# Patient Record
Sex: Female | Born: 1950 | Race: White | Hispanic: No | Marital: Married | Smoking: Former smoker
Health system: Southern US, Community
[De-identification: ages and names within clinical notes are randomized; demographics above are authoritative.]

## PROBLEM LIST (undated history)

## (undated) DIAGNOSIS — I10 Essential (primary) hypertension: Secondary | ICD-10-CM

## (undated) DIAGNOSIS — F419 Anxiety disorder, unspecified: Secondary | ICD-10-CM

## (undated) DIAGNOSIS — N760 Acute vaginitis: Secondary | ICD-10-CM

## (undated) DIAGNOSIS — K589 Irritable bowel syndrome without diarrhea: Secondary | ICD-10-CM

## (undated) DIAGNOSIS — C801 Malignant (primary) neoplasm, unspecified: Secondary | ICD-10-CM

## (undated) DIAGNOSIS — Z5189 Encounter for other specified aftercare: Secondary | ICD-10-CM

## (undated) DIAGNOSIS — E785 Hyperlipidemia, unspecified: Secondary | ICD-10-CM

## (undated) DIAGNOSIS — M1990 Unspecified osteoarthritis, unspecified site: Secondary | ICD-10-CM

## (undated) DIAGNOSIS — I509 Heart failure, unspecified: Secondary | ICD-10-CM

## (undated) DIAGNOSIS — R252 Cramp and spasm: Secondary | ICD-10-CM

## (undated) DIAGNOSIS — D649 Anemia, unspecified: Secondary | ICD-10-CM

## (undated) DIAGNOSIS — E039 Hypothyroidism, unspecified: Secondary | ICD-10-CM

## (undated) DIAGNOSIS — J189 Pneumonia, unspecified organism: Secondary | ICD-10-CM

## (undated) DIAGNOSIS — IMO0002 Reserved for concepts with insufficient information to code with codable children: Secondary | ICD-10-CM

## (undated) DIAGNOSIS — N189 Chronic kidney disease, unspecified: Secondary | ICD-10-CM

## (undated) DIAGNOSIS — K219 Gastro-esophageal reflux disease without esophagitis: Secondary | ICD-10-CM

## (undated) HISTORY — DX: Encounter for other specified aftercare: Z51.89

## (undated) HISTORY — PX: FRACTURE SURGERY: SHX138

## (undated) HISTORY — PX: OTHER SURGICAL HISTORY: SHX169

## (undated) HISTORY — PX: HAMMER TOE SURGERY: SHX385

## (undated) HISTORY — DX: Chronic kidney disease, unspecified: N18.9

## (undated) HISTORY — PX: COSMETIC SURGERY: SHX468

## (undated) HISTORY — PX: ABDOMINAL HYSTERECTOMY: SHX81

## (undated) HISTORY — DX: Hyperlipidemia, unspecified: E78.5

## (undated) HISTORY — PX: RHINOPLASTY: SUR1284

## (undated) HISTORY — PX: ORIF PATELLA: SHX5033

## (undated) HISTORY — DX: Heart failure, unspecified: I50.9

## (undated) HISTORY — DX: Malignant (primary) neoplasm, unspecified: C80.1

## (undated) HISTORY — PX: JOINT REPLACEMENT: SHX530

## (undated) HISTORY — PX: ORIF ULNAR FRACTURE: SHX5417

---

## 2008-02-15 ENCOUNTER — Inpatient Hospital Stay (HOSPITAL_COMMUNITY): Admission: RE | Admit: 2008-02-15 | Discharge: 2008-02-18 | Payer: Self-pay | Admitting: Orthopedic Surgery
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
## 2011-01-16 ENCOUNTER — Emergency Department (HOSPITAL_BASED_OUTPATIENT_CLINIC_OR_DEPARTMENT_OTHER)
Admission: EM | Admit: 2011-01-16 | Discharge: 2011-01-17 | Disposition: A | Discharge status: Home / Self Care | Payer: BC Managed Care – PPO | Attending: Emergency Medicine | Admitting: Emergency Medicine

## 2011-01-16 DIAGNOSIS — Z79899 Other long term (current) drug therapy: Secondary | ICD-10-CM | POA: Insufficient documentation

## 2011-01-16 DIAGNOSIS — R0789 Other chest pain: Secondary | ICD-10-CM | POA: Insufficient documentation

## 2011-01-16 DIAGNOSIS — E785 Hyperlipidemia, unspecified: Secondary | ICD-10-CM | POA: Insufficient documentation

## 2011-01-16 DIAGNOSIS — J189 Pneumonia, unspecified organism: Secondary | ICD-10-CM | POA: Insufficient documentation

## 2011-01-16 DIAGNOSIS — I10 Essential (primary) hypertension: Secondary | ICD-10-CM | POA: Insufficient documentation

## 2011-01-16 DIAGNOSIS — K219 Gastro-esophageal reflux disease without esophagitis: Secondary | ICD-10-CM | POA: Insufficient documentation

## 2011-01-17 ENCOUNTER — Emergency Department (INDEPENDENT_AMBULATORY_CARE_PROVIDER_SITE_OTHER): Payer: BC Managed Care – PPO

## 2011-01-17 DIAGNOSIS — J984 Other disorders of lung: Secondary | ICD-10-CM

## 2011-01-17 DIAGNOSIS — R071 Chest pain on breathing: Secondary | ICD-10-CM

## 2011-01-17 DIAGNOSIS — R509 Fever, unspecified: Secondary | ICD-10-CM

## 2011-01-17 LAB — CBC
HCT: 32.8 % — ABNORMAL LOW (ref 36.0–46.0)
Hemoglobin: 11.3 g/dL — ABNORMAL LOW (ref 12.0–15.0)
MCH: 31.7 pg (ref 26.0–34.0)
MCV: 91.9 fL (ref 78.0–100.0)
Platelets: 195 10*3/uL (ref 150–400)
RBC: 3.57 MIL/uL — ABNORMAL LOW (ref 3.87–5.11)
WBC: 8.4 10*3/uL (ref 4.0–10.5)

## 2011-01-17 LAB — DIFFERENTIAL
Lymphocytes Relative: 5 % — ABNORMAL LOW (ref 12–46)
Lymphs Abs: 0.4 10*3/uL — ABNORMAL LOW (ref 0.7–4.0)
Monocytes Relative: 11 % (ref 3–12)
Neutro Abs: 7 10*3/uL (ref 1.7–7.7)
Neutrophils Relative %: 84 % — ABNORMAL HIGH (ref 43–77)

## 2011-01-17 LAB — BASIC METABOLIC PANEL
BUN: 34 mg/dL — ABNORMAL HIGH (ref 6–23)
CO2: 27 mEq/L (ref 19–32)
Chloride: 96 mEq/L (ref 96–112)
Glucose, Bld: 151 mg/dL — ABNORMAL HIGH (ref 70–99)
Potassium: 3.3 mEq/L — ABNORMAL LOW (ref 3.5–5.1)

## 2011-05-16 LAB — CROSSMATCH
ABO/RH(D): A POS
ABO/RH(D): A POS
Antibody Screen: NEGATIVE
Antibody Screen: NEGATIVE

## 2011-05-16 LAB — CBC
HCT: 27.7 — ABNORMAL LOW
HCT: 39.2
HCT: 24.4 — ABNORMAL LOW
Hemoglobin: 10.9 — ABNORMAL LOW
MCHC: 34.5
MCHC: 34.9
MCV: 95.7
Platelets: 183
Platelets: 245
Platelets: 162
RBC: 3.42 — ABNORMAL LOW
RDW: 13.3
RDW: 14.8
WBC: 4
WBC: 6.2

## 2011-05-16 LAB — BASIC METABOLIC PANEL
BUN: 11
BUN: 5 — ABNORMAL LOW
Calcium: 7.5 — ABNORMAL LOW
Calcium: 7.9 — ABNORMAL LOW
Creatinine, Ser: 0.65
Creatinine, Ser: 0.77
GFR calc Af Amer: >60
GFR calc non Af Amer: >60
GFR calc non Af Amer: >60
GFR calc non Af Amer: >60
Glucose, Bld: 106 — ABNORMAL HIGH
Potassium: 3.6
Potassium: 3.4 — ABNORMAL LOW
Sodium: 139

## 2011-05-16 LAB — COMPREHENSIVE METABOLIC PANEL
Albumin: 4.6
BUN: 14
Calcium: 9.2
Chloride: 104
Creatinine, Ser: 0.69
GFR calc non Af Amer: >60
Total Bilirubin: 0.7

## 2011-05-16 LAB — URINALYSIS, ROUTINE W REFLEX MICROSCOPIC
Ketones, ur: NEGATIVE
Nitrite: NEGATIVE
Protein, ur: NEGATIVE

## 2011-05-16 LAB — DIFFERENTIAL
Basophils Absolute: 0
Lymphocytes Relative: 31
Monocytes Absolute: 0.4
Neutro Abs: 2.2

## 2011-05-16 LAB — PROTIME-INR: Prothrombin Time: 12.2

## 2011-05-16 LAB — URINE CULTURE
Colony Count: NO GROWTH
Culture: NO GROWTH

## 2011-05-16 LAB — APTT: aPTT: 27

## 2012-01-21 ENCOUNTER — Other Ambulatory Visit: Payer: Self-pay | Admitting: Orthopedic Surgery

## 2012-01-24 ENCOUNTER — Encounter (HOSPITAL_COMMUNITY): Payer: Self-pay | Admitting: Respiratory Therapy

## 2012-01-27 ENCOUNTER — Encounter (HOSPITAL_COMMUNITY): Payer: Self-pay

## 2012-01-27 NOTE — Pre-Procedure Instructions (Signed)
20 [name]  01/27/2012   Your procedure is scheduled on:  02/03/2012  Report to [facility] at 5:30 AM.  Call this number if you have problems the morning of surgery: [phone]   Remember:   Do not eat food:After Midnight.SUNDAY  May have clear liquids: up to 4 Hours before arrival.  Nothing after 1:30 a.m.   Clear liquids include soda, tea, black coffee, apple or grape juice, broth.  Take these medicines the morning of surgery with A SIP OF WATER: nexium, atenolol-chlorthalidone, synthroid   Do not wear jewelry, make-up or nail polish.  Do not wear lotions, powders, or perfumes. You may wear deodorant.  Do not shave 48 hours prior to surgery. Men may shave face and neck.  Do not bring valuables to the hospital.  Contacts, dentures or bridgework may not be worn into surgery.  Leave suitcase in the car. After surgery it may be brought to your room.  For patients admitted to the hospital, checkout time is 11:00 AM the day of discharge.   Patients discharged the day of surgery will not be allowed to drive home.  Name and phone number of your driver: /w spouse  Special Instructions: CHG Shower Use Special Wash: 1/2 bottle night before surgery and 1/2 bottle morning of surgery.   Please read over the following fact sheets that you were given: Pain Booklet, Coughing and Deep Breathing, Blood Transfusion Information, MRSA Information and Surgical Site Infection Prevention

## 2012-01-29 ENCOUNTER — Encounter (HOSPITAL_COMMUNITY)
Admission: RE | Admit: 2012-01-29 | Discharge: 2012-01-29 | Disposition: A | Discharge status: Home / Self Care | Payer: BC Managed Care – PPO | Source: Ambulatory Visit | Attending: Orthopedic Surgery | Admitting: Orthopedic Surgery

## 2012-01-29 LAB — PROTIME-INR
INR: 0.96 (ref 0.00–1.49)
Prothrombin Time: 13 seconds (ref 11.6–15.2)

## 2012-01-29 LAB — URINALYSIS, ROUTINE W REFLEX MICROSCOPIC
Glucose, UA: NEGATIVE mg/dL
Nitrite: NEGATIVE
Protein, ur: NEGATIVE mg/dL
Urobilinogen, UA: 0.2 mg/dL (ref 0.0–1.0)

## 2012-01-29 LAB — COMPREHENSIVE METABOLIC PANEL
AST: 30 U/L (ref 0–37)
CO2: 31 mEq/L (ref 19–32)
Calcium: 9.4 mg/dL (ref 8.4–10.5)
Creatinine, Ser: 1.08 mg/dL (ref 0.50–1.10)
GFR calc non Af Amer: 53 mL/min — ABNORMAL LOW (ref >90)

## 2012-01-29 LAB — CBC
MCH: 31.5 pg (ref 26.0–34.0)
MCV: 93 fL (ref 78.0–100.0)
Platelets: 259 10*3/uL (ref 150–400)
RBC: 4 MIL/uL (ref 3.87–5.11)
RDW: 12.8 % (ref 11.5–15.5)

## 2012-01-29 LAB — DIFFERENTIAL
Basophils Absolute: 0 10*3/uL (ref 0.0–0.1)
Lymphocytes Relative: 23 % (ref 12–46)
Monocytes Absolute: 0.5 10*3/uL (ref 0.1–1.0)
Neutro Abs: 4.1 10*3/uL (ref 1.7–7.7)

## 2012-01-29 LAB — URINE MICROSCOPIC-ADD ON

## 2012-01-30 LAB — URINE CULTURE

## 2012-01-30 NOTE — Consult Note (Addendum)
Anesthesia Chart Review: Patient is a 61 year old female scheduled for a right TKA on 02/03/12.  History includes former smoker, HTN, hypothyroidism, PNA, anemia, GERD, anxiety, left TKA '09.  PCP is Dr. Riley Nearing from [place].  Labs noted.  K+ 2.9 (meds include Tenoretic).  Urine culture is still pending.  Potassium results were called to Keri at Dr. Tobin Chad office.  Will order an ISTAT 4 on arrival to re-evaluate her hypokalemia.  She had a negative CXR on 01/29/12.  EKG on 12/30/11 (PCP) showed NSR.  Plan to proceed if repeat K+ is stable.  Shonna Chock, PA-C

## 2012-01-30 NOTE — Progress Notes (Signed)
Requested ekg from Dr.Aguiar's office at [facility] in [place], [place].

## 2012-01-31 ENCOUNTER — Other Ambulatory Visit: Payer: Self-pay | Admitting: Orthopedic Surgery

## 2012-02-02 MED ORDER — CEFAZOLIN SODIUM-DEXTROSE 2-3 GM-% IV SOLR
2.0000 g | INTRAVENOUS | Status: AC
  Administered 2012-02-03: 2 g via INTRAVENOUS
  Filled 2012-02-02: qty 50
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
## 2012-02-03 ENCOUNTER — Encounter (HOSPITAL_COMMUNITY): Payer: Self-pay | Admitting: Vascular Surgery

## 2012-02-03 ENCOUNTER — Ambulatory Visit (HOSPITAL_COMMUNITY): Payer: BC Managed Care – PPO | Admitting: Vascular Surgery

## 2012-02-03 ENCOUNTER — Encounter (HOSPITAL_COMMUNITY)
Admission: RE | Disposition: A | Discharge status: Home / Self Care | Payer: Self-pay | Source: Ambulatory Visit | Attending: Orthopedic Surgery

## 2012-02-03 ENCOUNTER — Encounter (HOSPITAL_COMMUNITY): Payer: Self-pay | Admitting: *Deleted

## 2012-02-03 ENCOUNTER — Inpatient Hospital Stay (HOSPITAL_COMMUNITY)
Admission: RE | Admit: 2012-02-03 | Discharge: 2012-02-06 | DRG: 209 | Disposition: A | Discharge status: Home / Self Care | Payer: BC Managed Care – PPO | Source: Ambulatory Visit | Attending: Orthopedic Surgery | Admitting: Orthopedic Surgery

## 2012-02-03 DIAGNOSIS — M19079 Primary osteoarthritis, unspecified ankle and foot: Secondary | ICD-10-CM | POA: Diagnosis present

## 2012-02-03 DIAGNOSIS — F410 Panic disorder [episodic paroxysmal anxiety] without agoraphobia: Secondary | ICD-10-CM | POA: Diagnosis present

## 2012-02-03 DIAGNOSIS — K219 Gastro-esophageal reflux disease without esophagitis: Secondary | ICD-10-CM | POA: Diagnosis present

## 2012-02-03 DIAGNOSIS — I10 Essential (primary) hypertension: Secondary | ICD-10-CM | POA: Diagnosis present

## 2012-02-03 DIAGNOSIS — Z01812 Encounter for preprocedural laboratory examination: Secondary | ICD-10-CM

## 2012-02-03 DIAGNOSIS — Z96659 Presence of unspecified artificial knee joint: Secondary | ICD-10-CM

## 2012-02-03 DIAGNOSIS — E890 Postprocedural hypothyroidism: Secondary | ICD-10-CM | POA: Diagnosis present

## 2012-02-03 DIAGNOSIS — M1712 Unilateral primary osteoarthritis, left knee: Secondary | ICD-10-CM

## 2012-02-03 DIAGNOSIS — Z8701 Personal history of pneumonia (recurrent): Secondary | ICD-10-CM

## 2012-02-03 DIAGNOSIS — Z9089 Acquired absence of other organs: Secondary | ICD-10-CM

## 2012-02-03 DIAGNOSIS — Z87891 Personal history of nicotine dependence: Secondary | ICD-10-CM

## 2012-02-03 DIAGNOSIS — M1710 Unilateral primary osteoarthritis, unspecified knee: Principal | ICD-10-CM | POA: Diagnosis present

## 2012-02-03 DIAGNOSIS — D62 Acute posthemorrhagic anemia: Secondary | ICD-10-CM | POA: Diagnosis not present

## 2012-02-03 HISTORY — DX: Pneumonia, unspecified organism: J18.9

## 2012-02-03 HISTORY — DX: Acute vaginitis: N76.0

## 2012-02-03 HISTORY — PX: TOTAL KNEE ARTHROPLASTY: SHX125

## 2012-02-03 HISTORY — DX: Hypothyroidism, unspecified: E03.9

## 2012-02-03 HISTORY — DX: Essential (primary) hypertension: I10

## 2012-02-03 HISTORY — DX: Anxiety disorder, unspecified: F41.9

## 2012-02-03 HISTORY — DX: Cramp and spasm: R25.2

## 2012-02-03 HISTORY — PX: KNEE SURGERY: SHX244

## 2012-02-03 HISTORY — DX: Anemia, unspecified: D64.9

## 2012-02-03 HISTORY — DX: Unspecified osteoarthritis, unspecified site: M19.90

## 2012-02-03 HISTORY — DX: Gastro-esophageal reflux disease without esophagitis: K21.9

## 2012-02-03 HISTORY — DX: Reserved for concepts with insufficient information to code with codable children: IMO0002

## 2012-02-03 LAB — POCT I-STAT 4, (NA,K, GLUC, HGB,HCT)
HCT: 35 % — ABNORMAL LOW (ref 36.0–46.0)
Hemoglobin: 11.9 g/dL — ABNORMAL LOW (ref 12.0–15.0)
Potassium: 2.9 mEq/L — ABNORMAL LOW (ref 3.5–5.1)
Sodium: 139 mEq/L (ref 135–145)

## 2012-02-03 SURGERY — ARTHROPLASTY, KNEE, TOTAL
Anesthesia: General | Site: Knee | Laterality: Right | Wound class: Clean

## 2012-02-03 MED ORDER — SODIUM CHLORIDE 0.9 % IR SOLN
Status: DC | PRN
  Administered 2012-02-03: 3000 mL

## 2012-02-03 MED ORDER — BUPIVACAINE 0.25 % ON-Q PUMP SINGLE CATH 300ML
300.0000 mL | INJECTION | Status: DC
  Filled 2012-02-03: qty 300

## 2012-02-03 MED ORDER — ATENOLOL 50 MG PO TABS
50.0000 mg | ORAL_TABLET | Freq: Every day | ORAL | Status: DC
Start: 2012-02-04 — End: 2012-02-06
  Administered 2012-02-04 – 2012-02-06 (×2): 50 mg via ORAL
  Filled 2012-02-03 (×4): qty 1

## 2012-02-03 MED ORDER — NEOSTIGMINE METHYLSULFATE 1 MG/ML IJ SOLN
INTRAMUSCULAR | Status: DC | PRN
  Administered 2012-02-03: 3 mg via INTRAVENOUS

## 2012-02-03 MED ORDER — METOCLOPRAMIDE HCL 5 MG/ML IJ SOLN
5.0000 mg | Freq: Three times a day (TID) | INTRAMUSCULAR | Status: DC | PRN
  Administered 2012-02-04: 10 mg via INTRAVENOUS
  Filled 2012-02-03: qty 2

## 2012-02-03 MED ORDER — OXYCODONE HCL 5 MG PO TABS
5.0000 mg | ORAL_TABLET | ORAL | Status: DC | PRN
  Administered 2012-02-03: 10 mg via ORAL
  Administered 2012-02-03 – 2012-02-04 (×2): 5 mg via ORAL
  Administered 2012-02-04 – 2012-02-06 (×6): 10 mg via ORAL
  Filled 2012-02-03 (×3): qty 2
  Filled 2012-02-03: qty 1
  Filled 2012-02-03 (×2): qty 2
  Filled 2012-02-03: qty 1
  Filled 2012-02-03: qty 2

## 2012-02-03 MED ORDER — OXYCODONE HCL 10 MG PO TB12
10.0000 mg | ORAL_TABLET | Freq: Two times a day (BID) | ORAL | Status: DC
  Administered 2012-02-03 – 2012-02-06 (×6): 10 mg via ORAL
  Filled 2012-02-03 (×6): qty 1

## 2012-02-03 MED ORDER — LEVOTHYROXINE SODIUM 125 MCG PO TABS
125.0000 ug | ORAL_TABLET | Freq: Every day | ORAL | Status: DC
  Administered 2012-02-04 – 2012-02-06 (×3): 125 ug via ORAL
  Filled 2012-02-03 (×4): qty 1

## 2012-02-03 MED ORDER — GLYCOPYRROLATE 0.2 MG/ML IJ SOLN
INTRAMUSCULAR | Status: DC | PRN
  Administered 2012-02-03: 0.4 mg via INTRAVENOUS

## 2012-02-03 MED ORDER — ACETAMINOPHEN 10 MG/ML IV SOLN
INTRAVENOUS | Status: AC
  Filled 2012-02-03: qty 100

## 2012-02-03 MED ORDER — ROCURONIUM BROMIDE 100 MG/10ML IV SOLN
INTRAVENOUS | Status: DC | PRN
  Administered 2012-02-03: 50 mg via INTRAVENOUS

## 2012-02-03 MED ORDER — CHLORHEXIDINE GLUCONATE 4 % EX LIQD: 60.0000 mL | Freq: Once | CUTANEOUS | Status: DC

## 2012-02-03 MED ORDER — ONDANSETRON HCL 4 MG/2ML IJ SOLN
4.0000 mg | Freq: Four times a day (QID) | INTRAMUSCULAR | Status: DC | PRN
  Administered 2012-02-03 – 2012-02-04 (×2): 4 mg via INTRAVENOUS
  Filled 2012-02-03 (×2): qty 2

## 2012-02-03 MED ORDER — FLEET ENEMA 7-19 GM/118ML RE ENEM: 1.0000 | ENEMA | Freq: Once | RECTAL | Status: AC | PRN

## 2012-02-03 MED ORDER — MIDAZOLAM HCL 2 MG/2ML IJ SOLN
1.0000 mg | Freq: Once | INTRAMUSCULAR | Status: AC
  Administered 2012-02-03: 0.5 mg via INTRAVENOUS

## 2012-02-03 MED ORDER — METRONIDAZOLE 500 MG PO TABS
500.0000 mg | ORAL_TABLET | Freq: Two times a day (BID) | ORAL | Status: DC
  Administered 2012-02-03 – 2012-02-05 (×6): 500 mg via ORAL
  Filled 2012-02-03 (×8): qty 1

## 2012-02-03 MED ORDER — MENTHOL 3 MG MT LOZG: 1.0000 | LOZENGE | OROMUCOSAL | Status: DC | PRN

## 2012-02-03 MED ORDER — BUPIVACAINE-EPINEPHRINE 0.25% -1:200000 IJ SOLN
INTRAMUSCULAR | Status: DC | PRN
  Administered 2012-02-03: 20 mL

## 2012-02-03 MED ORDER — IBUPROFEN 800 MG PO TABS
800.0000 mg | ORAL_TABLET | Freq: Three times a day (TID) | ORAL | Status: DC | PRN
  Filled 2012-02-03: qty 1

## 2012-02-03 MED ORDER — METHOCARBAMOL 100 MG/ML IJ SOLN: 500.0000 mg | Freq: Once | INTRAVENOUS | Status: DC

## 2012-02-03 MED ORDER — CHLORTHALIDONE 25 MG PO TABS
25.0000 mg | ORAL_TABLET | Freq: Every day | ORAL | Status: DC
  Administered 2012-02-04 – 2012-02-06 (×2): 25 mg via ORAL
  Filled 2012-02-03 (×4): qty 1

## 2012-02-03 MED ORDER — PHENOL 1.4 % MT LIQD: 1.0000 | OROMUCOSAL | Status: DC | PRN

## 2012-02-03 MED ORDER — ONDANSETRON HCL 4 MG PO TABS: 4.0000 mg | ORAL_TABLET | Freq: Four times a day (QID) | ORAL | Status: DC | PRN

## 2012-02-03 MED ORDER — SIMVASTATIN 40 MG PO TABS
40.0000 mg | ORAL_TABLET | Freq: Every evening | ORAL | Status: DC
  Administered 2012-02-03 – 2012-02-05 (×3): 40 mg via ORAL
  Filled 2012-02-03 (×4): qty 1

## 2012-02-03 MED ORDER — DIPHENHYDRAMINE HCL 12.5 MG/5ML PO ELIX: 12.5000 mg | ORAL_SOLUTION | ORAL | Status: DC | PRN

## 2012-02-03 MED ORDER — FENTANYL CITRATE 0.05 MG/ML IJ SOLN
INTRAMUSCULAR | Status: DC | PRN
  Administered 2012-02-03: 150 ug via INTRAVENOUS
  Administered 2012-02-03 (×4): 50 ug via INTRAVENOUS

## 2012-02-03 MED ORDER — HYDROMORPHONE HCL PF 1 MG/ML IJ SOLN
1.0000 mg | INTRAMUSCULAR | Status: DC | PRN
  Administered 2012-02-03 – 2012-02-05 (×5): 1 mg via INTRAVENOUS
  Filled 2012-02-03 (×5): qty 1

## 2012-02-03 MED ORDER — SENNOSIDES-DOCUSATE SODIUM 8.6-50 MG PO TABS: 1.0000 | ORAL_TABLET | Freq: Every evening | ORAL | Status: DC | PRN

## 2012-02-03 MED ORDER — METHOCARBAMOL 100 MG/ML IJ SOLN
500.0000 mg | Freq: Four times a day (QID) | INTRAVENOUS | Status: DC | PRN
  Administered 2012-02-03: 500 mg via INTRAVENOUS
  Filled 2012-02-03: qty 5

## 2012-02-03 MED ORDER — LACTATED RINGERS IV SOLN
INTRAVENOUS | Status: DC | PRN
  Administered 2012-02-03 (×3): via INTRAVENOUS

## 2012-02-03 MED ORDER — HYDROMORPHONE HCL PF 1 MG/ML IJ SOLN
INTRAMUSCULAR | Status: AC
  Administered 2012-02-04: 1 mg via INTRAVENOUS
  Filled 2012-02-03: qty 1

## 2012-02-03 MED ORDER — DOCUSATE SODIUM 100 MG PO CAPS
100.0000 mg | ORAL_CAPSULE | Freq: Two times a day (BID) | ORAL | Status: DC
  Administered 2012-02-03 – 2012-02-06 (×6): 100 mg via ORAL
  Filled 2012-02-03 (×8): qty 1

## 2012-02-03 MED ORDER — METHOCARBAMOL 100 MG/ML IJ SOLN
500.0000 mg | Freq: Once | INTRAVENOUS | Status: AC
  Administered 2012-02-03: 500 mg via INTRAVENOUS
  Filled 2012-02-03: qty 5

## 2012-02-03 MED ORDER — SODIUM CHLORIDE 0.9 % IV SOLN
INTRAVENOUS | Status: DC
  Administered 2012-02-03 – 2012-02-04 (×3): via INTRAVENOUS

## 2012-02-03 MED ORDER — MIDAZOLAM HCL 5 MG/5ML IJ SOLN
INTRAMUSCULAR | Status: DC | PRN
  Administered 2012-02-03 (×2): 1 mg via INTRAVENOUS

## 2012-02-03 MED ORDER — OXYCODONE HCL 5 MG PO TABS
ORAL_TABLET | ORAL | Status: AC
  Filled 2012-02-03: qty 2

## 2012-02-03 MED ORDER — LIDOCAINE HCL (CARDIAC) 20 MG/ML IV SOLN
INTRAVENOUS | Status: DC | PRN
  Administered 2012-02-03: 80 mg via INTRAVENOUS

## 2012-02-03 MED ORDER — CEPHALEXIN 500 MG PO CAPS
500.0000 mg | ORAL_CAPSULE | Freq: Three times a day (TID) | ORAL | Status: DC
  Administered 2012-02-05 (×3): 500 mg via ORAL
  Filled 2012-02-03 (×6): qty 1

## 2012-02-03 MED ORDER — METHOCARBAMOL 500 MG PO TABS
500.0000 mg | ORAL_TABLET | Freq: Four times a day (QID) | ORAL | Status: DC | PRN
  Administered 2012-02-03 – 2012-02-06 (×4): 500 mg via ORAL
  Filled 2012-02-03 (×5): qty 1

## 2012-02-03 MED ORDER — ALUM & MAG HYDROXIDE-SIMETH 200-200-20 MG/5ML PO SUSP: 30.0000 mL | ORAL | Status: DC | PRN

## 2012-02-03 MED ORDER — ONDANSETRON HCL 4 MG/2ML IJ SOLN
INTRAMUSCULAR | Status: DC | PRN
  Administered 2012-02-03: 4 mg via INTRAVENOUS

## 2012-02-03 MED ORDER — BUPIVACAINE ON-Q PAIN PUMP (FOR ORDER SET NO CHG)
INJECTION | Status: AC
  Filled 2012-02-03: qty 1

## 2012-02-03 MED ORDER — ZOLPIDEM TARTRATE 5 MG PO TABS: 5.0000 mg | ORAL_TABLET | Freq: Every evening | ORAL | Status: DC | PRN

## 2012-02-03 MED ORDER — BUPIVACAINE 0.25 % ON-Q PUMP SINGLE CATH 300ML
INJECTION | Status: DC | PRN
  Administered 2012-02-03: 300 mL

## 2012-02-03 MED ORDER — ACETAMINOPHEN 650 MG RE SUPP: 650.0000 mg | Freq: Four times a day (QID) | RECTAL | Status: DC | PRN

## 2012-02-03 MED ORDER — SENNA 8.6 MG PO TABS
1.0000 | ORAL_TABLET | Freq: Two times a day (BID) | ORAL | Status: DC
  Administered 2012-02-03 – 2012-02-06 (×6): 8.6 mg via ORAL
  Filled 2012-02-03 (×8): qty 1

## 2012-02-03 MED ORDER — ACETAMINOPHEN 10 MG/ML IV SOLN
1000.0000 mg | Freq: Four times a day (QID) | INTRAVENOUS | Status: DC
  Administered 2012-02-03: 1000 mg via INTRAVENOUS

## 2012-02-03 MED ORDER — HYDROMORPHONE HCL PF 1 MG/ML IJ SOLN
INTRAMUSCULAR | Status: AC
  Filled 2012-02-03: qty 1

## 2012-02-03 MED ORDER — PROPOFOL 10 MG/ML IV EMUL
INTRAVENOUS | Status: DC | PRN
  Administered 2012-02-03: 150 mg via INTRAVENOUS

## 2012-02-03 MED ORDER — PANTOPRAZOLE SODIUM 40 MG PO TBEC
80.0000 mg | DELAYED_RELEASE_TABLET | Freq: Every day | ORAL | Status: DC
  Administered 2012-02-04 – 2012-02-06 (×3): 80 mg via ORAL
  Filled 2012-02-03 (×3): qty 2

## 2012-02-03 MED ORDER — SODIUM CHLORIDE 0.9 % IV SOLN: INTRAVENOUS | Status: DC

## 2012-02-03 MED ORDER — HYDROMORPHONE HCL PF 1 MG/ML IJ SOLN
0.2500 mg | INTRAMUSCULAR | Status: DC | PRN
  Administered 2012-02-03 (×4): 0.5 mg via INTRAVENOUS

## 2012-02-03 MED ORDER — ACETAMINOPHEN 10 MG/ML IV SOLN
1000.0000 mg | Freq: Four times a day (QID) | INTRAVENOUS | Status: AC
  Administered 2012-02-03 – 2012-02-04 (×3): 1000 mg via INTRAVENOUS
  Filled 2012-02-03 (×4): qty 100

## 2012-02-03 MED ORDER — ENOXAPARIN SODIUM 30 MG/0.3ML ~~LOC~~ SOLN
30.0000 mg | Freq: Two times a day (BID) | SUBCUTANEOUS | Status: DC
  Administered 2012-02-04 – 2012-02-06 (×5): 30 mg via SUBCUTANEOUS
  Filled 2012-02-03 (×7): qty 0.3

## 2012-02-03 MED ORDER — EPHEDRINE SULFATE 50 MG/ML IJ SOLN
INTRAMUSCULAR | Status: DC | PRN
  Administered 2012-02-03 (×2): 5 mg via INTRAVENOUS

## 2012-02-03 MED ORDER — METOCLOPRAMIDE HCL 10 MG PO TABS: 5.0000 mg | ORAL_TABLET | Freq: Three times a day (TID) | ORAL | Status: DC | PRN

## 2012-02-03 MED ORDER — MIDAZOLAM HCL 2 MG/2ML IJ SOLN
INTRAMUSCULAR | Status: AC
  Filled 2012-02-03: qty 2

## 2012-02-03 MED ORDER — CEFAZOLIN SODIUM 1-5 GM-% IV SOLN
1.0000 g | Freq: Four times a day (QID) | INTRAVENOUS | Status: AC
  Administered 2012-02-03 (×2): 1 g via INTRAVENOUS
  Filled 2012-02-03 (×2): qty 50

## 2012-02-03 MED ORDER — BISACODYL 10 MG RE SUPP: 10.0000 mg | Freq: Every day | RECTAL | Status: DC | PRN

## 2012-02-03 MED ORDER — POTASSIUM CHLORIDE CRYS ER 10 MEQ PO TBCR
10.0000 meq | EXTENDED_RELEASE_TABLET | Freq: Two times a day (BID) | ORAL | Status: DC
  Administered 2012-02-03 – 2012-02-05 (×6): 10 meq via ORAL
  Filled 2012-02-03 (×8): qty 1

## 2012-02-03 MED ORDER — ACETAMINOPHEN 325 MG PO TABS
650.0000 mg | ORAL_TABLET | Freq: Four times a day (QID) | ORAL | Status: DC | PRN
  Administered 2012-02-04 – 2012-02-05 (×2): 650 mg via ORAL
  Filled 2012-02-03 (×2): qty 2

## 2012-02-03 MED ORDER — ATENOLOL-CHLORTHALIDONE 50-25 MG PO TABS: 1.0000 | ORAL_TABLET | Freq: Every day | ORAL | Status: DC

## 2012-02-03 MED ORDER — ALENDRONATE SODIUM 35 MG PO TABS: 35.0000 mg | ORAL_TABLET | ORAL | Status: DC

## 2012-02-03 SURGICAL SUPPLY — 58 items
BANDAGE ELASTIC 6 VELCRO ST LF (GAUZE/BANDAGES/DRESSINGS) ×1 IMPLANT
BANDAGE ESMARK 6X9 LF (GAUZE/BANDAGES/DRESSINGS) ×1 IMPLANT
BLADE SAGITTAL 13X1.27X60 (BLADE) ×2 IMPLANT
BLADE SAW SGTL 83.5X18.5 (BLADE) ×2 IMPLANT
BNDG CMPR 9X6 STRL LF SNTH (GAUZE/BANDAGES/DRESSINGS) ×1
BNDG ESMARK 6X9 LF (GAUZE/BANDAGES/DRESSINGS) ×2
BOWL SMART MIX CTS (DISPOSABLE) ×2 IMPLANT
CATH KIT ON Q 5IN SLV (PAIN MANAGEMENT) ×2 IMPLANT
CEMENT BONE SIMPLEX SPEEDSET (Cement) ×4 IMPLANT
CLOTH BEACON ORANGE TIMEOUT ST (SAFETY) ×2 IMPLANT
COVER BACK TABLE 24X17X13 BIG (DRAPES) IMPLANT
COVER SURGICAL LIGHT HANDLE (MISCELLANEOUS) ×2 IMPLANT
CUFF TOURNIQUET SINGLE 34IN LL (TOURNIQUET CUFF) ×2 IMPLANT
DRAPE EXTREMITY T 121X128X90 (DRAPE) ×2 IMPLANT
DRAPE INCISE IOBAN 66X45 STRL (DRAPES) ×4 IMPLANT
DRAPE PROXIMA HALF (DRAPES) ×2 IMPLANT
DRAPE U-SHAPE 47X51 STRL (DRAPES) ×2 IMPLANT
DRSG ADAPTIC 3X8 NADH LF (GAUZE/BANDAGES/DRESSINGS) ×2 IMPLANT
DRSG PAD ABDOMINAL 8X10 ST (GAUZE/BANDAGES/DRESSINGS) ×2 IMPLANT
DURAPREP 26ML APPLICATOR (WOUND CARE) ×4 IMPLANT
ELECT REM PT RETURN 9FT ADLT (ELECTROSURGICAL) ×2
ELECTRODE REM PT RTRN 9FT ADLT (ELECTROSURGICAL) ×1 IMPLANT
EVACUATOR 1/8 PVC DRAIN (DRAIN) ×2 IMPLANT
GLOVE BIOGEL M 7.0 STRL (GLOVE) IMPLANT
GLOVE BIOGEL PI IND STRL 7.5 (GLOVE) IMPLANT
GLOVE BIOGEL PI IND STRL 8.5 (GLOVE) ×2 IMPLANT
GLOVE BIOGEL PI INDICATOR 7.5 (GLOVE)
GLOVE BIOGEL PI INDICATOR 8.5 (GLOVE) ×2
GLOVE SURG ORTHO 8.0 STRL STRW (GLOVE) ×4 IMPLANT
GOWN PREVENTION PLUS XLARGE (GOWN DISPOSABLE) ×4 IMPLANT
GOWN STRL NON-REIN LRG LVL3 (GOWN DISPOSABLE) ×4 IMPLANT
HANDPIECE INTERPULSE COAX TIP (DISPOSABLE) ×2
HOOD PEEL AWAY FACE SHEILD DIS (HOOD) ×8 IMPLANT
KIT BASIN OR (CUSTOM PROCEDURE TRAY) ×2 IMPLANT
KIT ROOM TURNOVER OR (KITS) ×2 IMPLANT
MANIFOLD NEPTUNE II (INSTRUMENTS) ×2 IMPLANT
NEEDLE 22X1 1/2 (OR ONLY) (NEEDLE) IMPLANT
NS IRRIG 1000ML POUR BTL (IV SOLUTION) ×2 IMPLANT
PACK TOTAL JOINT (CUSTOM PROCEDURE TRAY) ×2 IMPLANT
PAD ARMBOARD 7.5X6 YLW CONV (MISCELLANEOUS) ×4 IMPLANT
PADDING CAST COTTON 6X4 STRL (CAST SUPPLIES) ×2 IMPLANT
POSITIONER HEAD PRONE TRACH (MISCELLANEOUS) ×2 IMPLANT
SET HNDPC FAN SPRY TIP SCT (DISPOSABLE) ×1 IMPLANT
SPONGE GAUZE 4X4 12PLY (GAUZE/BANDAGES/DRESSINGS) ×2 IMPLANT
STAPLER VISISTAT 35W (STAPLE) ×2 IMPLANT
SUCTION FRAZIER TIP 10 FR DISP (SUCTIONS) ×2 IMPLANT
SUT BONE WAX W31G (SUTURE) ×2 IMPLANT
SUT VIC AB 0 CTB1 27 (SUTURE) ×4 IMPLANT
SUT VIC AB 1 CT1 27 (SUTURE) ×2
SUT VIC AB 1 CT1 27XBRD ANBCTR (SUTURE) ×1 IMPLANT
SUT VIC AB 2-0 CT1 27 (SUTURE) ×4
SUT VIC AB 2-0 CT1 TAPERPNT 27 (SUTURE) ×2 IMPLANT
SUT VLOC 180 0 24IN GS25 (SUTURE) ×2 IMPLANT
SYR CONTROL 10ML LL (SYRINGE) IMPLANT
TOWEL OR 17X24 6PK STRL BLUE (TOWEL DISPOSABLE) ×2 IMPLANT
TOWEL OR 17X26 10 PK STRL BLUE (TOWEL DISPOSABLE) ×2 IMPLANT
TRAY FOLEY CATH 14FR (SET/KITS/TRAYS/PACK) ×2 IMPLANT
WATER STERILE IRR 1000ML POUR (IV SOLUTION) ×6 IMPLANT

## 2012-02-03 NOTE — OR Nursing (Signed)
Spoke with maurice pa re ongoing muscle spasms / ordered add'l dose ive robaxin now

## 2012-02-03 NOTE — Anesthesia Preprocedure Evaluation (Addendum)
Anesthesia Evaluation  Patient identified by MRN, date of birth, ID band Patient awake    Reviewed: Allergy & Precautions, H&P , NPO status , Patient's Chart, lab work & pertinent test results, reviewed documented beta blocker date and time   History of Anesthesia Complications Negative for: history of anesthetic complications  Airway Mallampati: II      Dental  (+) Teeth Intact and Dental Advisory Given   Pulmonary pneumonia ,          Cardiovascular hypertension, Pt. on medications and Pt. on home beta blockers Rhythm:Regular Rate:Normal     Neuro/Psych    GI/Hepatic GERD-  Medicated and Controlled,  Endo/Other  Hypothyroidism   Renal/GU negative Renal ROS     Musculoskeletal   Abdominal   Peds  Hematology negative hematology ROS (+)   Anesthesia Other Findings   Reproductive/Obstetrics                          Anesthesia Physical Anesthesia Plan  ASA: III  Anesthesia Plan: General   Post-op Pain Management:    Induction: Intravenous  Airway Management Planned: Oral ETT  Additional Equipment:   Intra-op Plan:   Post-operative Plan:   Informed Consent:   Plan Discussed with: CRNA  Anesthesia Plan Comments:         Anesthesia Quick Evaluation

## 2012-02-03 NOTE — Op Note (Signed)
TOTAL KNEE REPLACEMENT OPERATIVE NOTE:  02/03/2012  2:15 PM  PATIENT:  [name]  61 y.o. female  PRE-OPERATIVE DIAGNOSIS:  osteoarthritis right knee  POST-OPERATIVE DIAGNOSIS:  osteoarthritis right knee  PROCEDURE:  Procedure(s): TOTAL KNEE ARTHROPLASTY  SURGEON:  Surgeon(s): Raymon Mutton, MD  PHYSICIAN ASSISTANT: Altamese Cabal, [facility]  ANESTHESIA:   general  DRAINS: Hemovac and On-Q Marcaine Pain Pump  SPECIMEN: None  COUNTS:  Correct  TOURNIQUET:   Total Tourniquet Time Documented: Thigh (Right) - 52 minutes  DICTATION:  Indication for procedure:    The patient is a 61 y.o. female who has failed conservative treatment for osteoarthritis right knee.  Informed consent was obtained prior to anesthesia. The risks versus benefits of the operation were explain and in a way the patient can, and did, understand.   Description of procedure:     The patient was taken to the operating room and placed under anesthesia.  The patient was positioned in the usual fashion taking care that all body parts were adequately padded and/or protected.  I foley catheter was placed.  A tourniquet was applied and the leg prepped and draped in the usual sterile fashion.  The extremity was exsanguinated with the esmarch and tourniquet inflated to 350 mmHg.  Pre-operative range of motion was normal.  The knee was in 9 degree of significant valgus.  A midline incision approximately 6-7 inches long was made with a #10 blade.  A new blade was used to make a parapatellar arthrotomy going 2-3 cm into the quadriceps tendon, over the patella, and alongside the medial aspect of the patellar tendon.  A synovectomy was then performed with the #10 blade and forceps. I then elevated the deep MCL off the medial tibial metaphysis subperiosteally around to the semimembranosus attachment.    I everted the patella and used calipers to measure patellar thickness.  I used the reamer to ream down to appropriate  thickness to recreate the native thickness.  I then removed excess bone with the rongeur and sagittal saw.  I used the appropriately sized template and drilled the three lug holes.  I then put the trial in place and measured the thickness with the calipers to ensure recreation of the native thickness.  The trial was then removed and the patella subluxed and the knee brought into flexion.  A homan retractor was place to retract and protect the patella and lateral structures.  A Z-retractor was place medially to protect the medial structures.  The extra-medullary alignment system was used to make cut the tibial articular surface perpendicular to the anamotic axis of the tibia and in 3 degrees of posterior slope.  The cut surface and alignment jig was removed.  I then used the intramedullary alignment guide to make a 4 valgus cut on the distal femur.  I then marked out the epicondylar axis on the distal femur.  The posterior condylar axis measured 5 degrees.  I then used the anterior referencing sizer and measured the femur to be a size F.  The 4-In-1 cutting block was screwed into place in external rotation matching the posterior condylar angle, making our cuts perpendicular to the epicondylar axis.  Anterior, posterior and chamfer cuts were made with the sagittal saw.  The cutting block and cut pieces were removed.  A lamina spreader was placed in 90 degrees of flexion.  The ACL, PCL, menisci, and posterior condylar osteophytes were removed.  A 10 mm spacer blocked was found to offer good flexion  and extension gap balance after moderate in degree releasing.   The scoop retractor was then placed and the femoral finishing block was pinned in place.  The small sagittal saw was used as well as the lug drill to finish the femur.  The block and cut surfaces were removed and the medullary canal hole filled with autograft bone from the cut pieces.  The tibia was delivered forward in deep flexion and external  rotation.  A size 5 tray was selected and pinned into place centered on the medial 1/3 of the tibial tubercle.  The reamer and keel was used to prepare the tibia through the tray.    I then trialed with the size F femur, size 5 tibia, a 10 mm insert and the 32 patella.  I had excellent flexion/extension gap balance, excellent patella tracking.  Flexion was full and beyond 120 degrees; extension was zero.  These components were chosen and the staff opened them to me on the back table while the knee was lavaged copiously and the cement mixed.  I cemented in the components and removed all excess cement.  The polyethylene tibial component was snapped into place and the knee placed in extension while cement was hardening.  The capsule was infilltrated with 20cc of .25% Marcaine with epinephrine.  A hemovac was place in the joint exiting superolaterally.  A pain pump was place superomedially superficial to the arthrotomy.  Once the cement was hard, the tourniquet was let down.  Hemostasis was obtained.  The arthrotomy was closed with figure-8 #1 vicryl sutures.  The deep soft tissues were closed with #0 vicryls and the subcuticular layer closed with a running #2-0 vicryl.  The skin was reapproximated and closed with skin staples.  The wound was dressed with xeroform, 4 x4's, 2 ABD sponges, a single layer of webril and a TED stocking.   The patient was then awakened, extubated, and taken to the recovery room in stable condition.  BLOOD LOSS:  300cc DRAINS: 1 hemovac, 1 pain catheter COMPLICATIONS:  None.  PLAN OF CARE: Admit to inpatient   PATIENT DISPOSITION:  PACU - hemodynamically stable.   Delay start of Pharmacological VTE agent (>24hrs) due to surgical blood loss or risk of bleeding:  not applicable  Please fax a copy of this op note to my office at [phone] (please only include page 1 and 2 of the Case Information op note)

## 2012-02-03 NOTE — Anesthesia Procedure Notes (Addendum)
Procedure Name: Intubation Date/Time: 02/03/2012 7:44 AM Performed by: Lovie Chol Pre-anesthesia Checklist: Patient identified, Emergency Drugs available, Suction available, Patient being monitored and Timeout performed Patient Re-evaluated:Patient Re-evaluated prior to inductionOxygen Delivery Method: Circle system utilized Preoxygenation: Pre-oxygenation with 100% oxygen Intubation Type: IV induction Ventilation: Mask ventilation without difficulty Laryngoscope Size: Miller and 2 Grade View: Grade I Tube type: Oral Tube size: 7.5 mm Number of attempts: 1 Airway Equipment and Method: Stylet Placement Confirmation: ETT inserted through vocal cords under direct vision,  positive ETCO2 and breath sounds checked- equal and bilateral Secured at: 21 cm Tube secured with: Tape Dental Injury: Teeth and Oropharynx as per pre-operative assessment    Anesthesia Regional Block:  Femoral nerve block  Pre-Anesthetic Checklist: ,, timeout performed, Correct Patient, Correct Site, Correct Laterality, Correct Procedure, Correct Position, site marked, Risks and benefits discussed,  Surgical consent,  Pre-op evaluation,  At surgeon's request and post-op pain management  Laterality: Right  Prep: chloraprep       Needles:   Needle Type: Echogenic Needle          Additional Needles:  Procedures: Doppler guided Femoral nerve block  Nerve Stimulator or Paresthesia:  Response: 0.5 mA,   Additional Responses:   Narrative:  Start time: 02/03/2012 7:10 AM End time: 02/03/2012 7:25 AM  Performed by: Personally  Anesthesiologist: Edwards  Femoral nerve block

## 2012-02-03 NOTE — Preoperative (Signed)
Beta Blockers   Reason not to administer Beta Blockers:Not Applicable 

## 2012-02-03 NOTE — Anesthesia Postprocedure Evaluation (Signed)
  Anesthesia Post-op Note  Patient: [name]  Procedure(s) Performed: Procedure(s) (LRB): TOTAL KNEE ARTHROPLASTY (Right)  Patient Location: PACU  Anesthesia Type: General  Level of Consciousness: awake  Airway and Oxygen Therapy: Patient Spontanous Breathing  Post-op Pain: mild  Post-op Assessment: Post-op Vital signs reviewed  Post-op Vital Signs: Reviewed  Complications: No apparent anesthesia complications

## 2012-02-03 NOTE — Transfer of Care (Signed)
Immediate Anesthesia Transfer of Care Note  Patient: [name]  Procedure(s) Performed: Procedure(s) (LRB): TOTAL KNEE ARTHROPLASTY (Right)  Patient Location: PACU  Anesthesia Type: General  Level of Consciousness: awake, oriented and patient cooperative  Airway & Oxygen Therapy: Patient Spontanous Breathing and Patient connected to nasal cannula oxygen  Post-op Assessment: Report given to PACU RN and Post -op Vital signs reviewed and stable  Post vital signs: Reviewed  Complications: No apparent anesthesia complications

## 2012-02-03 NOTE — Progress Notes (Signed)
Orthopedic Tech Progress Note Patient Details:  [name] [birth year] [id number]  CPM Right Knee CPM Right Knee: On Right Knee Flexion (Degrees): 90  Right Knee Extension (Degrees): 0    [name] 02/03/2012, 10:17 AM

## 2012-02-03 NOTE — Progress Notes (Signed)
UR Completed. Mar Walmer, RN, Nurse Case Manager [phone]     

## 2012-02-03 NOTE — Progress Notes (Signed)
PHARMACIST - PHYSICIAN COMMUNICATION  CONCERNING: P&T Medication Policy Regarding Oral Bisphosphonates  RECOMMENDATION: Your order for alendronate (Fosamax) has been discontinued at this time.  If the patient's post-hospital medical condition warrants safe use of this class of drugs, please resume the pre-hospital regimen upon discharge.  DESCRIPTION:  Alendronate (Fosamax) can cause severe esophageal erosions in patients who are unable to remain upright at least 30 minutes after taking this medication.   Since brief interruptions in therapy are thought to have minimal impact on bone mineral density, the Pharmacy & Therapeutics Committee has established that bisphosphonate orders should be routinely discontinued during hospitalization.   To override this safety policy and permit administration of Boniva, Fosamax, or Actonel in the hospital, prescribers must write "DO NOT HOLD" in the comments section when placing the order for this class of medications.  Reece Leader, Pharm D 02/03/2012 11:30 AM

## 2012-02-04 ENCOUNTER — Encounter (HOSPITAL_COMMUNITY): Payer: Self-pay | Admitting: General Practice

## 2012-02-04 LAB — BASIC METABOLIC PANEL
BUN: 12 mg/dL (ref 6–23)
Chloride: 92 mEq/L — ABNORMAL LOW (ref 96–112)
GFR calc Af Amer: 76 mL/min — ABNORMAL LOW (ref >90)
GFR calc non Af Amer: 66 mL/min — ABNORMAL LOW (ref >90)
Potassium: 3.1 mEq/L — ABNORMAL LOW (ref 3.5–5.1)
Sodium: 132 mEq/L — ABNORMAL LOW (ref 135–145)

## 2012-02-04 LAB — CBC
HCT: 26.6 % — ABNORMAL LOW (ref 36.0–46.0)
MCHC: 34.6 g/dL (ref 30.0–36.0)
Platelets: 177 10*3/uL (ref 150–400)
RDW: 13.1 % (ref 11.5–15.5)
WBC: 6.8 10*3/uL (ref 4.0–10.5)

## 2012-02-04 NOTE — Progress Notes (Signed)
Physical Therapy Treatment Patient Details Name: MY RINKE MRN: [id number] DOB: 1951/03/13 Today's Date: 02/04/2012 Time: 9528-4132 PT Time Calculation (min): 14 min  PT Assessment / Plan / Recommendation Comments on Treatment Session  Pt admitted s/p R TKA and is limited by severe pain and nausea. Only able to complete exercises in the bed. Pt will continue to benefit from PT to facilitate a safe D/C home with HHPT.     Follow Up Recommendations  Home health PT    Barriers to Discharge None      Equipment Recommendations  None recommended by PT    Recommendations for Other Services    Frequency 7X/week   Plan Discharge plan remains appropriate;Frequency remains appropriate    Precautions / Restrictions Precautions Precautions: Knee Precaution Booklet Issued: No Restrictions Weight Bearing Restrictions: Yes RLE Weight Bearing: Weight bearing as tolerated   Pertinent Vitals/Pain Pt reports 10/10 pain at R knee. Pt repositioned and communicated with RN.     Mobility  Bed Mobility Bed Mobility: Not assessed Transfers Transfers: Not assessed  Ambulation/Gait Ambulation/Gait Assistance: Not tested (comment) Stairs: No Wheelchair Mobility Wheelchair Mobility: No    Exercises Total Joint Exercises Ankle Circles/Pumps: AROM;Right;15 reps;Supine Quad Sets: AROM;Right;15 reps;Supine Heel Slides: AAROM;Right;15 reps;Supine Hip ABduction/ADduction: AAROM;Right;15 reps;Supine Straight Leg Raises: AAROM;Right;15 reps;Supine   PT Diagnosis: Difficulty walking;Acute pain  PT Problem List: Decreased strength;Decreased range of motion;Decreased activity tolerance;Decreased mobility;Decreased knowledge of use of DME;Pain PT Treatment Interventions: DME instruction;Gait training;Stair training;Functional mobility training;Therapeutic exercise;Therapeutic activities;Patient/family education   PT Goals Acute Rehab PT Goals PT Goal Formulation: With patient Time For Goal  Achievement: 02/11/12 Potential to Achieve Goals: Good Pt will Perform Home Exercise Program: Independently PT Goal: Perform Home Exercise Program - Progress: Progressing toward goal  Visit Information  Last PT Received On: 02/04/12 Assistance Needed: +1 PT/OT Co-Evaluation/Treatment: Yes    Subjective Data  Subjective: "I'm in a lot of pain." Patient Stated Goal: Go home.    Cognition  Overall Cognitive Status: Appears within functional limits for tasks assessed/performed Arousal/Alertness: Awake/alert Orientation Level: Appears intact for tasks assessed Behavior During Session: [facility] for tasks performed    Balance  Balance Balance Assessed: No  End of Session PT - End of Session Equipment Utilized During Treatment: Gait belt Activity Tolerance: Patient tolerated treatment well;Patient limited by pain Patient left: in bed;with call bell/phone within reach Nurse Communication: Mobility status;Patient requests pain meds CPM Right Knee CPM Right Knee: Off    Oretha Ellis 02/04/2012, 2:15 PM

## 2012-02-04 NOTE — Evaluation (Signed)
Agree with evaluation and goals.  02/04/2012 Cephus Shelling, PT, DPT [phone]

## 2012-02-04 NOTE — Progress Notes (Signed)
Agree with treatment note.  02/04/2012 Cephus Shelling, PT, DPT [phone]

## 2012-02-04 NOTE — Progress Notes (Signed)
CARE MANAGEMENT NOTE 02/04/2012  Patient:  [name], [name]   Account Number:  [id number]  Date Initiated:  02/04/2012  Documentation initiated by:  Vance Peper  Subjective/Objective Assessment:   61 yr old female s/p right total knee arthroplasty     Action/Plan:   CM spoke with patient regarding home health and DME needs. Patient was preoperatively setuyp with [facility], no changes. Patient states she will need a hospital bed, explained she will have to pay to rent it. DME to be delivered.   Anticipated DC Date:  02/06/2012   Anticipated DC Plan:  HOME W HOME HEALTH SERVICES      DC Planning Services  CM consult      [facility] Choice  HOME HEALTH  DURABLE MEDICAL EQUIPMENT   Choice offered to / List presented to:  C-1 Patient   DME arranged  HOSPITAL BED  CPM  WALKER - ROLLING      DME agency  [employer]     HH arranged  HH-2 PT      [facility] agency  [facility]   Status of service:  In process, will continue to follow

## 2012-02-04 NOTE — Evaluation (Signed)
Physical Therapy Evaluation Patient Details Name: [name] MRN: [id number] DOB: 1951-07-19 Today's Date: 02/04/2012 Time: 7829-5621 PT Time Calculation (min): 43 min  PT Assessment / Plan / Recommendation Clinical Impression  Pt is a 61 y.o. female admitted s/p R TKA along with PT problem list below. Pt very nauseous this AM, but able to walk into the hall. Pt would benefit from continued PT to maximize independence and facilitate a safe D/C home with HHPT.     PT Assessment  Patient needs continued PT services    Follow Up Recommendations  Home health PT    Barriers to Discharge None      lEquipment Recommendations  None recommended by PT    Recommendations for Other Services     Frequency 7X/week    Precautions / Restrictions Precautions Precautions: Knee Precaution Booklet Issued: No Restrictions Weight Bearing Restrictions: Yes RLE Weight Bearing: Weight bearing as tolerated   Pertinent Vitals/Pain Pt reports 8/10 pain at R knee. Premedicated by RN.       Mobility  Bed Mobility Bed Mobility: Supine to Sit Supine to Sit: 4: Min assist Details for Bed Mobility Assistance: Assist to bring R LE to EOB. Cues for sequence and safe hand placement.  Transfers Transfers: Sit to Stand;Stand to Sit Sit to Stand: 4: Min assist;From bed;With upper extremity assist Stand to Sit: 4: Min assist;With upper extremity assist;To bed;With armrests Details for Transfer Assistance: Assist for balance. Cues for safe hand placement, sequence, and R LE placement.  Ambulation/Gait Ambulation/Gait Assistance: 4: Min assist Ambulation Distance (Feet): 50 Feet Assistive device: Rolling walker Ambulation/Gait Assistance Details: Assist for balance and to off weight R LE. Cues for initial heel contact, but pt c/o stiffness in the AM with R ankle fusion done previously. Cues for tall posture and sequence.  Gait Pattern: Step-to pattern;Decreased step length - right;Decreased stance time -  right (R ankle fusion previous. Unable to do inital heel contact. ) Stairs: No Wheelchair Mobility Wheelchair Mobility: No    Exercises Total Joint Exercises Ankle Circles/Pumps: AROM;Strengthening;10 reps;Supine Quad Sets: AROM;Right;10 reps;Supine Heel Slides: AAROM;Right;10 reps;Supine   PT Diagnosis: Difficulty walking;Acute pain  PT Problem List: Decreased strength;Decreased range of motion;Decreased activity tolerance;Decreased mobility;Decreased knowledge of use of DME;Pain PT Treatment Interventions: DME instruction;Gait training;Stair training;Functional mobility training;Therapeutic exercise;Therapeutic activities;Patient/family education   PT Goals Acute Rehab PT Goals PT Goal Formulation: With patient Time For Goal Achievement: 02/11/12 Potential to Achieve Goals: Good Pt will go Supine/Side to Sit: with modified independence PT Goal: Supine/Side to Sit - Progress: Goal set today Pt will go Sit to Supine/Side: with modified independence PT Goal: Sit to Supine/Side - Progress: Goal set today Pt will go Sit to Stand: with modified independence PT Goal: Sit to Stand - Progress: Goal set today Pt will go Stand to Sit: with modified independence PT Goal: Stand to Sit - Progress: Goal set today Pt will Ambulate: >150 feet;with modified independence;with rolling walker PT Goal: Ambulate - Progress: Goal set today Pt will Go Up / Down Stairs: 3-5 stairs;with modified independence;with least restrictive assistive device PT Goal: Up/Down Stairs - Progress: Goal set today Pt will Perform Home Exercise Program: Independently PT Goal: Perform Home Exercise Program - Progress: Goal set today  Visit Information  Last PT Received On: 02/04/12 Assistance Needed: +1 PT/OT Co-Evaluation/Treatment: Yes    Subjective Data  Subjective: "I've had one of these before." Patient Stated Goal: Go home.    Prior Functioning  Home Living Lives With: Family Available  Help at Discharge:  Family Type of Home: House Home Access: Stairs to enter [employer] of Steps: 4 Entrance Stairs-Rails: None Home Layout: Able to live on main level with bedroom/bathroom Bathroom Shower/Tub: Walk-in shower;Door [employer] Toilet: Standard Bathroom Accessibility: Yes Home Adaptive Equipment: Walker - rolling;Shower chair without back;Bedside commode/3-in-1 Prior Function Level of Independence: Independent Able to Take Stairs?: Yes Driving: Yes Vocation: Full time employment Communication Communication: No difficulties    Cognition  Overall Cognitive Status: Appears within functional limits for tasks assessed/performed Arousal/Alertness: Awake/alert Orientation Level: Appears intact for tasks assessed Behavior During Session: [facility] for tasks performed    Extremity/Trunk Assessment Right Upper Extremity Assessment RUE ROM/Strength/Tone: WFL for tasks assessed RUE Sensation: WFL - Light Touch RUE Coordination: WFL - gross/fine motor Left Upper Extremity Assessment LUE ROM/Strength/Tone: WFL for tasks assessed LUE Sensation: WFL - Light Touch LUE Coordination: WFL - gross/fine motor Right Lower Extremity Assessment RLE ROM/Strength/Tone: Deficits RLE ROM/Strength/Tone Deficits: R KNEE A/AAROM 0-70 degrees; 2+/5 throughout RLE Sensation: WFL - Light Touch RLE Coordination: WFL - gross/fine motor Left Lower Extremity Assessment LLE ROM/Strength/Tone: Within functional levels LLE Sensation: WFL - Light Touch LLE Coordination: WFL - gross/fine motor Trunk Assessment Trunk Assessment: Normal   Balance Balance Balance Assessed: No  End of Session PT - End of Session Equipment Utilized During Treatment: Gait belt Activity Tolerance: Patient tolerated treatment well;Patient limited by fatigue;Other (comment) (Pt c/o nausea throughout session. Vomiting at the end. ) Patient left: in chair;with call bell/phone within reach Nurse Communication: Mobility status;Patient requests  pain meds CPM Right Knee CPM Right Knee: Off   Oretha Ellis 02/04/2012, 12:46 PM

## 2012-02-04 NOTE — Evaluation (Addendum)
Occupational Therapy Evaluation Patient Details Name: [name] MRN: [id number] DOB: Sep 05, 1950 Today's Date: 02/04/2012 Time: 2952-8413 OT Time Calculation (min): 19 min  OT Assessment / Plan / Recommendation Clinical Impression  Pt is a 61 yo female s/p R TKA and displays increased pain, nausea and lightheadeded and decreased strength. Will benefit from skilled OT services to increase indepedndence with self care tasks for discharge home with family.     OT Assessment  Patient needs continued OT Services    Follow Up Recommendations  No OT follow up vs Home health OT depending on progress and family assist.   Barriers to Discharge      Equipment Recommendations  None recommended by OT    Recommendations for Other Services    Frequency  Min 2X/week    Precautions / Restrictions Precautions Precautions: Knee Precaution Booklet Issued: No Restrictions Weight Bearing Restrictions: Yes RLE Weight Bearing: Weight bearing as tolerated        ADL  Eating/Feeding: Simulated;Independent Where Assessed - Eating/Feeding: Chair Grooming: Simulated;Wash/dry face;Set up Where Assessed - Grooming: Supported sitting Upper Body Bathing: Simulated;Chest;Right arm;Left arm;Abdomen;Supervision/safety;Set up;Other (comment) (pt alittle dizzy and nauseous) Where Assessed - Upper Body Bathing: Unsupported sitting Lower Body Bathing: Simulated;Moderate assistance Where Assessed - Lower Body Bathing: Supported sit to stand Upper Body Dressing: Simulated;Supervision/safety;Set up Where Assessed - Upper Body Dressing: Unsupported sitting Lower Body Dressing: Simulated;Moderate assistance Where Assessed - Lower Body Dressing: Sopported sit to stand Toilet Transfer: Simulated;Minimal assistance;Other (comment) (difficulty putting R LE fully on floor) Toilet Transfer Method: Other (comment) (ambulating. cotx with PT ambulate in hallway with chair close behind due to nausea and  lightheadedness) Toileting - Clothing Manipulation and Hygiene: Simulated;Moderate assistance Where Assessed - Toileting Clothing Manipulation and Hygiene: Standing Tub/Shower Transfer Method: Not assessed Equipment Used: Rolling walker ADL Comments: Pt limited by lightheadedness and nausea. Did vomit after ambulating. Nsg aware. Cotx with PT for ambulation in hallway with chair following behind and then chair pulled up to sit. Pt had other knee done in the past but would like to review ADL. Pt states she had a reacher but daughter uses it?? May benefit from some AE initially as pt states she needs to be able to perform these tasks independently soon.    OT Diagnosis: Generalized weakness  OT Problem List: Decreased strength;Decreased knowledge of use of DME or AE;Pain OT Treatment Interventions: Self-care/ADL training;Therapeutic activities;DME and/or AE instruction;Patient/family education   OT Goals Acute Rehab OT Goals OT Goal Formulation: With patient Time For Goal Achievement: 02/11/12 Potential to Achieve Goals: Good ADL Goals Pt Will Perform Grooming: with supervision;Standing at sink ADL Goal: Grooming - Progress: Goal set today Pt Will Perform Lower Body Bathing: with supervision;Sit to stand from chair;Sit to stand from bed;with adaptive equipment ADL Goal: Lower Body Bathing - Progress: Goal set today Pt Will Perform Lower Body Dressing: with supervision;Sit to stand from chair;Sit to stand from bed;with adaptive equipment ADL Goal: Lower Body Dressing - Progress: Goal set today Pt Will Transfer to Toilet: with supervision;Ambulation;with DME;3-in-1 ADL Goal: Toilet Transfer - Progress: Goal set today Pt Will Perform Toileting - Clothing Manipulation: with supervision;Standing ADL Goal: Toileting - Clothing Manipulation - Progress: Goal set today Pt Will Perform Toileting - Hygiene: with supervision;Sit to stand from 3-in-1/toilet ADL Goal: Toileting - Hygiene - Progress: Goal  set today Pt Will Perform Tub/Shower Transfer: with supervision;Shower transfer;with DME ADL Goal: Tub/Shower Transfer - Progress: Goal set today  Visit Information  Last OT Received On: 02/04/12  Assistance Needed: +1 PT/OT Co-Evaluation/Treatment: Yes    Subjective Data  Subjective: I do feel nauseous Patient Stated Goal: none stated independently. agreeable to PT/OT   Prior Functioning  Home Living Lives With: Family Available Help at Discharge: Family Type of Home: House Home Access: Stairs to enter Secretary/administrator of Steps: 4 Entrance Stairs-Rails: None Home Layout: Able to live on main level with bedroom/bathroom Bathroom Shower/Tub: Walk-in shower;Door [employer] Toilet: Standard Bathroom Accessibility: Yes Home Adaptive Equipment: Walker - rolling;Bedside commode/3-in-1;Built-in shower seat Prior Function Level of Independence: Independent Able to Take Stairs?: Yes Driving: Yes Vocation: Full time employment Communication Communication: No difficulties    Cognition  Overall Cognitive Status: Appears within functional limits for tasks assessed/performed Arousal/Alertness: Awake/alert Orientation Level: Appears intact for tasks assessed Behavior During Session: [facility] for tasks performed    Extremity/Trunk Assessment Right Upper Extremity Assessment RUE ROM/Strength/Tone: [employer] for tasks assessed Left Upper Extremity Assessment LUE ROM/Strength/Tone: WFL for tasks assessed   Mobility Transfers Transfers: Sit to Stand;Stand to Sit Sit to Stand: 4: Min assist;With upper extremity assist;From bed;From chair/3-in-1 Stand to Sit: 4: Min assist;With upper extremity assist;To chair/3-in-1 Details for Transfer Assistance: verbal cues for hand placement and R LE extended out in front prior to sit and stand.    Exercise    Balance    End of Session OT - End of Session Equipment Utilized During Treatment: Gait belt Activity Tolerance: Other (comment) (nausea and  lightheadedness) Patient left: in chair;with call bell/phone within reach   Lennox Laity [phone] 02/04/2012, 9:15 AM

## 2012-02-04 NOTE — Progress Notes (Signed)
Amanda Spurling, MD   Altamese Cabal, PA-C [address] [place], [place], [place]  [address]                             [phone]   PROGRESS NOTE  Subjective:  negative for Chest Pain  negative for Shortness of Breath  positive for Nausea/Vomiting   negative for Calf Pain  negative for Bowel Movement   Tolerating Diet: yes         Patient reports pain as 4 on 0-10 scale.    Objective: Vital signs in last 24 hours:   Patient Vitals for the past 24 hrs:  BP Temp Temp src Pulse Resp SpO2  02/04/12 0814 130/50 mmHg - - 80  - -  02/04/12 0619 135/52 mmHg 98.9 F (37.2 C) - 86  18  100 %  02/04/12 0400 - - - - 16  100 %  02/04/12 0210 119/72 mmHg 98 F (36.7 C) - 74  18  100 %  02/04/12 0000 - - - - 16  99 %  02/03/12 2101 115/66 mmHg 97.2 F (36.2 C) Oral 63  18  100 %  02/03/12 2000 - - - - 6  100 %  02/03/12 1600 - - - - 16  98 %  02/03/12 1442 116/59 mmHg - - 57  18  100 %  02/03/12 1200 - - - - 16  96 %  02/03/12 1120 135/65 mmHg 97 F (36.1 C) - 61  18  97 %  02/03/12 1032 - - - 54  14  100 %  02/03/12 1031 - - - 67  21  100 %  02/03/12 1030 - 97 F (36.1 C) - 58  18  100 %  02/03/12 1029 - - - 60  14  100 %  02/03/12 1028 - - - 56  16  100 %  02/03/12 1027 110/46 mmHg - - 55  13  100 %  02/03/12 1026 - - - 56  16  100 %  02/03/12 1025 - - - 59  12  90 %  02/03/12 1024 - - - 55  8  82 %  02/03/12 1023 - - - 52  9  92 %  02/03/12 1022 - - - 56  13  100 %  02/03/12 1021 - - - 56  14  99 %  02/03/12 1020 149/59 mmHg - - 59  17  100 %  02/03/12 1019 - - - 65  22  99 %  02/03/12 1018 - - - 61  18  99 %  02/03/12 1017 - - - 60  15  100 %  02/03/12 1016 - - - 63  20  100 %  02/03/12 1015 - - - 59  12  99 %  02/03/12 1014 95/75 mmHg - - 59  17  100 %  02/03/12 1013 - - - 58  12  100 %  02/03/12 1012 - - - 62  16  100 %  02/03/12 1011 - - - 62  16  100 %  02/03/12 0939 - 96.9 F (36.1 C) - - - -    @flow {1959:LAST@   Intake/Output from previous day:   06/17  0701 - 06/18 0700 In: 2360 [P.O.:360; I.V.:2000] Out: 2890 [Urine:2050; Drains:740]   Intake/Output this shift:       Intake/Output      06/17 0701 - 06/18 0700 06/18  0701 - 06/19 0700   P.O. 360    I.V. 2000    Total Intake 2360    Urine 2050    Drains 740    Blood 100    Total Output 2890    Net -530            LABORATORY DATA:  Basename 02/04/12 0632 02/03/12 0625 01/29/12 1408  WBC 6.8 -- 6.1  HGB 9.2* 11.9* 12.6  HCT 26.6* 35.0* 37.2  PLT 177 -- 259    Basename 02/04/12 0632 02/03/12 0625 01/29/12 1408  NA 132* 139 138  K 3.1* 2.9* 2.9*  CL 92* -- 97  CO2 31 -- 31  BUN 12 -- 27*  CREATININE 0.91 -- 1.08  GLUCOSE 128* 110* 101*  CALCIUM 8.4 -- 9.4   Lab Results  Component Value Date   INR 0.96 01/29/2012   INR 0.9 02/08/2008    Examination:  General appearance: alert, cooperative and no distress Extremities: Homans sign is negative, no sign of DVT  Wound Exam: clean, dry, intact   Drainage:  Scant/small amount Serosanguinous exudate  Motor Exam: EHL and FHL Intact  Sensory Exam: Deep Peroneal normal  Vascular Exam:    Assessment:    1 Day Post-Op  Procedure(s) (LRB): TOTAL KNEE ARTHROPLASTY (Right)  ADDITIONAL DIAGNOSIS:  Active Problems:  * No active hospital problems. *   Acute Blood Loss Anemia   Plan: Physical Therapy as ordered Weight Bearing as Tolerated (WBAT)  DVT Prophylaxis:  Lovenox  DISCHARGE PLAN: Home  DISCHARGE NEEDS: HHPT, CPM, Walker and 3-in-1 comode seat         [name] 02/04/2012, 8:29 AM

## 2012-02-05 LAB — BASIC METABOLIC PANEL
BUN: 10 mg/dL (ref 6–23)
Chloride: 96 mEq/L (ref 96–112)
GFR calc Af Amer: 70 mL/min — ABNORMAL LOW (ref >90)
GFR calc non Af Amer: 60 mL/min — ABNORMAL LOW (ref >90)
Glucose, Bld: 134 mg/dL — ABNORMAL HIGH (ref 70–99)
Potassium: 2.8 mEq/L — ABNORMAL LOW (ref 3.5–5.1)
Sodium: 133 mEq/L — ABNORMAL LOW (ref 135–145)

## 2012-02-05 LAB — CBC
HCT: 23.6 % — ABNORMAL LOW (ref 36.0–46.0)
Hemoglobin: 7.8 g/dL — ABNORMAL LOW (ref 12.0–15.0)
MCHC: 33.1 g/dL (ref 30.0–36.0)
RBC: 2.51 MIL/uL — ABNORMAL LOW (ref 3.87–5.11)
WBC: 6.3 10*3/uL (ref 4.0–10.5)

## 2012-02-05 NOTE — Progress Notes (Signed)
Agree with treatment session.  02/05/2012 [name] [name], PT, DPT [phone]   

## 2012-02-05 NOTE — Progress Notes (Signed)
02/05/12 1440  PT Visit Information  Last PT Received On 02/05/12  Assistance Needed +1  PT Time Calculation  PT Start Time 1403  PT Stop Time 1417  PT Time Calculation (min) 14 min  Subjective Data  Subjective "I'm heading home."  Patient Stated Goal Go home.  Precautions  Precautions Knee  Precaution Booklet Issued No  Restrictions  Weight Bearing Restrictions Yes  RLE Weight Bearing WBAT  Cognition  Overall Cognitive Status Appears within functional limits for tasks assessed/performed  Arousal/Alertness Awake/alert  Orientation Level Appears intact for tasks assessed  Behavior During Session [facility] for tasks performed  Bed Mobility  Bed Mobility Not assessed  Transfers  Transfers Sit to Stand;Stand to Sit  Sit to Stand 6: Modified independent (Device/Increase time)  Stand to Sit 6: Modified independent (Device/Increase time)  Ambulation/Gait  Ambulation/Gait Assistance 5: Supervision  Ambulation Distance (Feet) 125 Feet  Assistive device Rolling walker  Ambulation/Gait Assistance Details Cues for tall posture and step through gait pattern.   Gait Pattern Step-to pattern;Step-through pattern;Decreased step length - right;Decreased stance time - right  Stairs Yes  Stairs Assistance 4: Min guard (Guard for balance. Cues for sequence.)  Stair Management Technique No rails;Backwards;Step to pattern;With walker  Number of Stairs 2   Wheelchair Mobility  Wheelchair Mobility No  Balance  Balance Assessed No  PT - End of Session  Equipment Utilized During Treatment Gait belt  Activity Tolerance Patient tolerated treatment well;Patient limited by fatigue  Patient left in chair;with call bell/phone within reach  Nurse Communication Mobility status  PT - Assessment/Plan  Comments on Treatment Session Pt admitted s/p R TKA and progressing well. Tolerated stair training as well as greater ambulation distance this PM. Pt is ready for a safe D/C home with HHPT once medically cleared  by MD.   PT Plan Discharge plan remains appropriate;Frequency remains appropriate  PT Frequency 7X/week  Follow Up Recommendations Home health PT  Equipment Recommended None recommended by PT  Acute Rehab PT Goals  PT Goal Formulation With patient  Time For Goal Achievement 02/11/12  Potential to Achieve Goals Good  PT Goal: Sit to Stand - Progress Met  PT Goal: Stand to Sit - Progress Met  PT Goal: Ambulate - Progress Progressing toward goal  PT Goal: Up/Down Stairs - Progress Progressing toward goal   Pt reports 4/10 pain at R Knee. Pt repositioned.   Oretha Ellis, SPT

## 2012-02-05 NOTE — Progress Notes (Signed)
Physical Therapy Treatment Patient Details Name: [name] MRN: [id number] DOB: September 25, 1950 Today's Date: 02/05/2012 Time: 2440-1027 PT Time Calculation (min): 23 min  PT Assessment / Plan / Recommendation Comments on Treatment Session  Pt admitted s/p R TKA and progressing well. Tolerated greater distance in ambulation despite low hemoglobin. Pt will continue to benefit from PT to facilitate a safe D/C home with HHPT.     Follow Up Recommendations  Home health PT    Barriers to Discharge        Equipment Recommendations  None recommended by PT    Recommendations for Other Services    Frequency 7X/week   Plan Discharge plan remains appropriate;Frequency remains appropriate    Precautions / Restrictions Precautions Precautions: Knee Precaution Booklet Issued: No Restrictions Weight Bearing Restrictions: Yes RLE Weight Bearing: Weight bearing as tolerated   Pertinent Vitals/Pain Pt reports 5/10 pain at R knee. Pt repositioned.     Mobility  Bed Mobility Bed Mobility: Supine to Sit Supine to Sit: 4: Min guard Details for Bed Mobility Assistance: Guard for balance. Cues to hook R LE over L LE to bring to EOB and sequence. Transfers Transfers: Sit to Stand;Stand to Sit Sit to Stand: 4: Min guard;With upper extremity assist;From bed Stand to Sit: 4: Min guard;To chair/3-in-1;With armrests;With upper extremity assist Details for Transfer Assistance: Guard for balance. Cues for safe hand placement and sequence.  Ambulation/Gait Ambulation/Gait Assistance: 4: Min guard Ambulation Distance (Feet): 75 Feet Assistive device: Rolling walker Ambulation/Gait Assistance Details: Guard for balance. Cues for tall posture. Morning stiffness in ankle, so unable to place R heel on ground.  Gait Pattern: Step-to pattern;Decreased step length - right;Decreased stance time - right Stairs: No Wheelchair Mobility Wheelchair Mobility: No    Exercises Total Joint Exercises Ankle  Circles/Pumps: AROM;Right;10 reps;Supine Quad Sets: AROM;Right;10 reps;Supine Short Arc Quad: AAROM;Right;10 reps;Supine Heel Slides: AAROM;Right;10 reps;Supine Hip ABduction/ADduction: AAROM;Right;10 reps;Supine Straight Leg Raises: AAROM;Right;10 reps;Supine Goniometric ROM: R KNEE A/AAROM 0-60 degrees   PT Diagnosis:    PT Problem List:   PT Treatment Interventions:     PT Goals Acute Rehab PT Goals PT Goal Formulation: With patient Time For Goal Achievement: 02/11/12 Potential to Achieve Goals: Good PT Goal: Supine/Side to Sit - Progress: Progressing toward goal PT Goal: Sit to Stand - Progress: Progressing toward goal PT Goal: Stand to Sit - Progress: Progressing toward goal PT Goal: Ambulate - Progress: Progressing toward goal PT Goal: Perform Home Exercise Program - Progress: Progressing toward goal  Visit Information  Last PT Received On: 02/05/12 Assistance Needed: +1    Subjective Data  Subjective: "I got up twice last night." Patient Stated Goal: Go home.   Cognition  Overall Cognitive Status: Appears within functional limits for tasks assessed/performed Arousal/Alertness: Awake/alert Orientation Level: Appears intact for tasks assessed Behavior During Session: [facility] for tasks performed    Balance  Balance Balance Assessed: No  End of Session PT - End of Session Equipment Utilized During Treatment: Gait belt Activity Tolerance: Patient tolerated treatment well;Patient limited by fatigue Patient left: in chair;with call bell/phone within reach Nurse Communication: Mobility status    Oretha Ellis 02/05/2012, 8:18 AM

## 2012-02-05 NOTE — Progress Notes (Signed)
Occupational Therapy Treatment and Discharge Patient Details Name: [name] MRN: [id number] DOB: 1951/02/25 Today's Date: 02/05/2012 Time: 7829-5621 OT Time Calculation (min): 38 min  OT Assessment / Plan / Recommendation Comments on Treatment Session Pt has made excellent progress, all education completed, D/C from acute OT.    Follow Up Recommendations  No OT follow up    Barriers to Discharge       Equipment Recommendations  None recommended by OT    Recommendations for Other Services    Frequency Min 2X/week   Plan Discharge plan needs to be updated    Precautions / Restrictions Precautions Precautions: Knee Precaution Booklet Issued: No Restrictions Weight Bearing Restrictions: Yes RLE Weight Bearing: Weight bearing as tolerated   Pertinent Vitals/Pain 6/10 right knee    ADL  Grooming: Performed;Teeth care;Set up;Supervision/safety;Brushing hair Where Assessed - Grooming: Unsupported standing Lower Body Dressing: Simulated;Moderate assistance (Right LE only) Where Assessed - Lower Body Dressing: Unsupported sitting Toilet Transfer: Performed;Modified independent Toilet Transfer Method: Sit to stand Toilet Transfer Equipment: Raised toilet seat with arms (or 3-in-1 over toilet) Toileting - Clothing Manipulation and Hygiene: Performed;Independent Where Assessed - Toileting Clothing Manipulation and Hygiene: Sit to stand from 3-in-1 or toilet Tub/Shower Transfer: Performed;Supervision/safety Tub/Shower Transfer Method: Science writer: Shower seat with back Equipment Used: Rolling walker    OT Diagnosis:    OT Problem List:   OT Treatment Interventions:     OT Goals ADL Goals ADL Goal: Grooming - Progress: Met ADL Goal: Lower Body Dressing - Progress: Progressing toward goals (pt will have A prn and has AE at home) ADL Goal: Toilet Transfer - Progress: Met ADL Goal: Toileting - Clothing Manipulation - Progress: Met ADL Goal:  Toileting - Hygiene - Progress: Met ADL Goal: Tub/Shower Transfer - Progress: Met  Visit Information  Last OT Received On: 02/05/12 Assistance Needed: +1    Subjective Data      Prior Functioning       Cognition  Overall Cognitive Status: Appears within functional limits for tasks assessed/performed Arousal/Alertness: Awake/alert Orientation Level: Appears intact for tasks assessed Behavior During Session: [facility] for tasks performed    Mobility Transfers Transfers: Sit to Stand;Stand to Sit Sit to Stand: 6: Modified independent (Device/Increase time);With upper extremity assist;With armrests;From chair/3-in-1 Stand to Sit: 6: Modified independent (Device/Increase time);With upper extremity assist;With armrests;To chair/3-in-1 Details for Transfer Assistance: Guard for balance. Cues for safe hand placement and sequence.          End of Session OT - End of Session Activity Tolerance: Patient tolerated treatment well   Evette Georges 02/05/2012, 10:34 AM

## 2012-02-05 NOTE — Progress Notes (Signed)
Agree with treatment session.  02/05/2012 Nikyla Navedo M Francina Beery, PT, DPT [phone]   

## 2012-02-05 NOTE — Progress Notes (Signed)
  [name], MD   Altamese Cabal, PA-C [address] [place], [place], [place]  [address]                             [phone]   PROGRESS NOTE  Subjective:  negative for Chest Pain  negative for Shortness of Breath  negative for Nausea/Vomiting   negative for Calf Pain  negative for Bowel Movement  Positive for dizziness when standing and lightheadedness   Tolerating Diet: yes         Patient reports pain as 4 on 0-10 scale.    Objective: Vital signs in last 24 hours:   Patient Vitals for the past 24 hrs:  BP Temp Temp src Pulse Resp SpO2  02/05/12 1300 118/48 mmHg 100.2 F (37.9 C) - 67  18  94 %  02/05/12 0522 125/50 mmHg 97.8 F (36.6 C) Oral 74  18  94 %  02/05/12 0100 - 99.7 F (37.6 C) Oral - - -  02/05/12 0000 - - - - 20  93 %  02/04/12 2103 115/47 mmHg 100 F (37.8 C) Oral 85  17  96 %  02/04/12 2000 - - - - 18  94 %  02/04/12 1600 - - - - 16  96 %    @flow {1959:LAST@   Intake/Output from previous day:   06/18 0701 - 06/19 0700 In: 120 [P.O.:120] Out: -    Intake/Output this shift:       Intake/Output      06/18 0701 - 06/19 0700 06/19 0701 - 06/20 0700   P.O. 120    I.V.     Total Intake 120    Urine     Drains     Blood     Total Output     Net +120         Emesis Occurrence 2 x       LABORATORY DATA:  Basename 02/05/12 0501 02/04/12 0632 02/03/12 0625  WBC 6.3 6.8 --  HGB 7.8* 9.2* 11.9*  HCT 23.6* 26.6* 35.0*  PLT 154 177 --    Basename 02/05/12 0501 02/04/12 0632 02/03/12 0625  NA 133* 132* 139  K 2.8* 3.1* 2.9*  CL 96 92* --  CO2 28 31 --  BUN 10 12 --  CREATININE 0.98 0.91 --  GLUCOSE 134* 128* 110*  CALCIUM 7.7* 8.4 --   Lab Results  Component Value Date   INR 0.96 01/29/2012   INR 0.9 02/08/2008    Examination:  General appearance: alert, appears stated age and no distress Extremities: Homans sign is negative, no sign of DVT  Wound Exam: clean, dry, intact   Drainage:  None: wound tissue dry  Motor Exam:  EHL and FHL Intact  Sensory Exam: Deep Peroneal normal  Vascular Exam:    Assessment:    2 Days Post-Op  Procedure(s) (LRB): TOTAL KNEE ARTHROPLASTY (Right)  ADDITIONAL DIAGNOSIS:  Active Problems:  * No active hospital problems. *   Acute Blood Loss Anemia   Plan: Physical Therapy as ordered Weight Bearing as Tolerated (WBAT)  DVT Prophylaxis:  Lovenox  DISCHARGE PLAN: Home  DISCHARGE NEEDS: HHPT, CPM, Walker and 3-in-1 comode seat         Jaymen Fetch 02/05/2012, 3:04 PM

## 2012-02-05 NOTE — Care Management Note (Signed)
    Page 1 of 2   02/05/2012     3:13:49 PM   CARE MANAGEMENT NOTE 02/05/2012  Patient:  [name], [name]   Account Number:  [id number]  Date Initiated:  02/04/2012  Documentation initiated by:  Vance Peper  Subjective/Objective Assessment:   61 yr old female s/p right total knee arthroplasty     Action/Plan:   CM spoke with patient regarding home health and DME needs. Patient was preoperatively setuyp with [facility], no changes. Patient states she will need a hospital bed, explained she will have to pay to rent it. DME to be delivered.   Anticipated DC Date:  02/06/2012   Anticipated DC Plan:  HOME W HOME HEALTH SERVICES      DC Planning Services  CM consult      [facility] Choice  HOME HEALTH  DURABLE MEDICAL EQUIPMENT   Choice offered to / List presented to:  C-1 Patient   DME arranged  HOSPITAL BED  [facility]  [facility]      DME agency  [employer]     HH arranged  HH-2 PT      [facility] agency  [facility]   Status of service:  In process, will continue to follow Medicare Important Message given?   (If response is "NO", the following Medicare IM given date fields will be blank) Date Medicare IM given:   Date Additional Medicare IM given:    Discharge Disposition:    Per UR Regulation:    If discussed at Long Length of Stay Meetings, dates discussed:    Comments:  02/05/12 Onnie Boer, RN, BSN 1511 PT WAS SUPPOSE TO DC TODAY WITH HOSP BED FROM [employer].  I HAVE CONTACTED RHONDA TO LET HER KNOW THAT PT WILL NOT BE DC'D UNTIL TOMORROW

## 2012-02-06 LAB — CBC
MCH: 30.4 pg (ref 26.0–34.0)
MCV: 88.8 fL (ref 78.0–100.0)
Platelets: 161 10*3/uL (ref 150–400)
RDW: 15.1 % (ref 11.5–15.5)

## 2012-02-06 LAB — TYPE AND SCREEN
Antibody Screen: NEGATIVE
Unit division: 0
Unit division: 0

## 2012-02-06 LAB — BASIC METABOLIC PANEL
Calcium: 8.2 mg/dL — ABNORMAL LOW (ref 8.4–10.5)
Creatinine, Ser: 0.79 mg/dL (ref 0.50–1.10)
GFR calc Af Amer: >90 mL/min (ref >90)

## 2012-02-06 MED ORDER — POTASSIUM CHLORIDE CRYS ER 20 MEQ PO TBCR
40.0000 meq | EXTENDED_RELEASE_TABLET | Freq: Once | ORAL | Status: AC
  Administered 2012-02-06: 40 meq via ORAL

## 2012-02-06 MED ORDER — OXYCODONE HCL 10 MG PO TB12: 10.0000 mg | ORAL_TABLET | Freq: Two times a day (BID) | ORAL | Status: DC

## 2012-02-06 MED ORDER — METHOCARBAMOL 500 MG PO TABS: 500.0000 mg | ORAL_TABLET | Freq: Four times a day (QID) | ORAL | Status: AC | PRN

## 2012-02-06 MED ORDER — POTASSIUM CHLORIDE CRYS ER 10 MEQ PO TBCR: 10.0000 meq | EXTENDED_RELEASE_TABLET | Freq: Two times a day (BID) | ORAL | Status: DC

## 2012-02-06 MED ORDER — OXYCODONE HCL 5 MG PO TABS: 5.0000 mg | ORAL_TABLET | ORAL | Status: AC | PRN

## 2012-02-06 MED ORDER — POTASSIUM CHLORIDE CRYS ER 20 MEQ PO TBCR
EXTENDED_RELEASE_TABLET | ORAL | Status: AC
  Filled 2012-02-06: qty 2

## 2012-02-06 MED ORDER — ENOXAPARIN SODIUM 40 MG/0.4ML ~~LOC~~ SOLN: 40.0000 mg | Freq: Every day | SUBCUTANEOUS | Status: DC

## 2012-02-06 NOTE — Progress Notes (Signed)
CRITICAL VALUE ALERT  Critical value received:  2.5K+  Date of notification: 02/06/2012  Time of notification:  0730  Critical value read back:yes  Nurse who received alert:  Maryfrances Bunnell  MD notified (1st page): Altamese Cabal PA  Time of first page: 0800  MD notified (2nd page):  Time of second page:  Responding MD: Altamese Cabal PA   Time MD responded:0830

## 2012-02-06 NOTE — Progress Notes (Signed)
Physical Therapy Treatment Patient Details Name: [name] MRN: [id number] DOB: 12-Jul-1951 Today's Date: 02/06/2012 Time: 4098-1191 PT Time Calculation (min): 23 min  PT Assessment / Plan / Recommendation Comments on Treatment Session  pt rpesents s/p R TKA.  pt moving well and continuing to make great progress.  pt to D/C home today.  Reviewed car transfer.      Follow Up Recommendations  Home health PT;Supervision - Intermittent    Barriers to Discharge        Equipment Recommendations  None recommended by PT    Recommendations for Other Services    Frequency 7X/week   Plan Discharge plan remains appropriate;Frequency remains appropriate    Precautions / Restrictions Precautions Precautions: Knee Restrictions Weight Bearing Restrictions: Yes RLE Weight Bearing: Weight bearing as tolerated   Pertinent Vitals/Pain 5/10    Mobility  Bed Mobility Bed Mobility: Supine to Sit;Sitting - Scoot to [place] of Bed;Sit to Supine Supine to Sit: 4: Min assist Sitting - Scoot to Edge of Bed: 5: Supervision Sit to Supine: 5: Supervision Details for Bed Mobility Assistance: A with R LE getting OOB secodnary to noting increased pain today and having just taken pain meds.   Transfers Transfers: Sit to Stand;Stand to Sit Sit to Stand: 6: Modified independent (Device/Increase time);With upper extremity assist;From bed Stand to Sit: 6: Modified independent (Device/Increase time);With upper extremity assist;To bed Ambulation/Gait Ambulation/Gait Assistance: 5: Supervision Ambulation Distance (Feet): 160 Feet Assistive device: Rolling walker Ambulation/Gait Assistance Details: cues for upright posture and increasing step length on L LE.   Gait Pattern: Step-through pattern;Decreased step length - left;Decreased stance time - right Stairs: Yes Stairs Assistance: 4: Min assist Stairs Assistance Details (indicate cue type and reason): had pt verbalize safe technique for stairs with RW.     Stair Management Technique: No rails;Backwards;With walker Number of Stairs: 2  Wheelchair Mobility Wheelchair Mobility: No    Exercises     PT Diagnosis:    PT Problem List:   PT Treatment Interventions:     PT Goals Acute Rehab PT Goals Time For Goal Achievement: 02/11/12 PT Goal: Supine/Side to Sit - Progress: Progressing toward goal PT Goal: Sit to Supine/Side - Progress: Progressing toward goal PT Goal: Sit to Stand - Progress: Met PT Goal: Stand to Sit - Progress: Met PT Goal: Ambulate - Progress: Progressing toward goal PT Goal: Up/Down Stairs - Progress: Progressing toward goal PT Goal: Perform Home Exercise Program - Progress: Progressing toward goal  Visit Information  Last PT Received On: 02/06/12 Assistance Needed: +1    Subjective Data  Subjective: I'm just ready to go when you say I can.     Cognition  Overall Cognitive Status: Appears within functional limits for tasks assessed/performed Arousal/Alertness: Awake/alert Orientation Level: Appears intact for tasks assessed Behavior During Session: [facility] for tasks performed    Balance  Balance Balance Assessed: No  End of Session PT - End of Session Equipment Utilized During Treatment: Gait belt Activity Tolerance: Patient tolerated treatment well;Patient limited by fatigue Patient left: in bed;with call bell/phone within reach Nurse Communication: Mobility status    Sunny Schlein, [place] [phone] 02/06/2012, 1:29 PM

## 2012-02-06 NOTE — Progress Notes (Signed)
Amanda Spurling, MD   Altamese Cabal, PA-C [address] [place], [place], [place]  [address]                             [phone]   PROGRESS NOTE  Subjective:  negative for Chest Pain  negative for Shortness of Breath  negative for Nausea/Vomiting   negative for Calf Pain  negative for Bowel Movement   Tolerating Diet: yes         Patient reports pain as 4 on 0-10 scale.    Objective: Vital signs in last 24 hours:   Patient Vitals for the past 24 hrs:  BP Temp Temp src Pulse Resp SpO2  02/06/12 0716 134/77 mmHg - - 81  - -  02/06/12 0706 122/68 mmHg 98.9 F (37.2 C) - 54  16  99 %  02/06/12 0400 - - - - 16  100 %  02/06/12 0115 131/67 mmHg 99.1 F (37.3 C) - 80  14  -  02/06/12 0030 128/68 mmHg 100.1 F (37.8 C) - 83  16  -  02/06/12 0000 - - - - 16  100 %  02/05/12 2330 113/64 mmHg 100.2 F (37.9 C) - 84  14  -  02/05/12 2319 121/65 mmHg 100.3 F (37.9 C) - 80  16  99 %  02/05/12 2230 118/63 mmHg 100.4 F (38 C) - 82  16  -  02/05/12 2200 113/63 mmHg 100.2 F (37.9 C) - 84  18  -  02/05/12 2000 - - - - 14  99 %  02/05/12 1930 112/61 mmHg 98.8 F (37.1 C) - 82  18  -  02/05/12 1719 109/61 mmHg 98.8 F (37.1 C) - 84  16  -  02/05/12 1600 - - - - 16  99 %  02/05/12 1500 114/66 mmHg 98.8 F (37.1 C) Oral 92  18  92 %  02/05/12 1300 118/48 mmHg 100.2 F (37.9 C) - 67  18  94 %  02/05/12 1200 - - - - 16  92 %    @flow {1959:LAST@   Intake/Output from previous day:   06/19 0701 - 06/20 0700 In: 515 [I.V.:150] Out: -    Intake/Output this shift:   06/20 0701 - 06/20 1900 In: 240 [P.O.:240] Out: -    Intake/Output      06/19 0701 - 06/20 0700 06/20 0701 - 06/21 0700   P.O.  240   I.V. 150    Blood 365    Total Intake 515 240   Net +515 +240        Urine Occurrence 2 x 3 x      LABORATORY DATA:  Basename 02/06/12 0547 02/05/12 0501 02/04/12 0632 02/03/12 0625  WBC 5.7 6.3 6.8 --  HGB 9.8* 7.8* 9.2* 11.9*  HCT 28.6* 23.6* 26.6* 35.0*  PLT 161 154  177 --    Basename 02/06/12 0547 02/05/12 0501 02/04/12 0632 02/03/12 0625  NA 136 133* 132* 139  K 2.5* 2.8* 3.1* 2.9*  CL 96 96 92* --  CO2 33* 28 31 --  BUN 10 10 12  --  CREATININE 0.79 0.98 0.91 --  GLUCOSE 118* 134* 128* 110*  CALCIUM 8.2* 7.7* 8.4 --   Lab Results  Component Value Date   INR 0.96 01/29/2012   INR 0.9 02/08/2008    Examination:  General appearance: alert, cooperative and no distress Extremities: Homans sign is negative, no sign of DVT  Wound Exam: clean, dry, intact   Drainage:  None: wound tissue dry  Motor Exam: EHL and FHL Intact  Sensory Exam: Deep Peroneal normal  Vascular Exam:    Assessment:    3 Days Post-Op  Procedure(s) (LRB): TOTAL KNEE ARTHROPLASTY (Right)  ADDITIONAL DIAGNOSIS:  Active Problems:  * No active hospital problems. *   Acute Blood Loss Anemia   Plan: Physical Therapy as ordered Weight Bearing as Tolerated (WBAT)  DVT Prophylaxis:  Lovenox  DISCHARGE PLAN: Home  DISCHARGE NEEDS: HHPT, CPM, Walker and 3-in-1 comode seat         [name] 02/06/2012, 9:17 AM
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
## 2012-02-06 NOTE — Discharge Instructions (Signed)
Diet: As you were doing prior to hospitalization   Activity:  Increase activity slowly as tolerated                  No lifting or driving for 6 weeks  Shower:  May shower without a dressing once there is no drainage from your wound.                 Do NOT wash over the wound.  If drainage remains, cover wound with saran                  Wrap and then shower.  Clean incision with betadine and change dressing                        After saran wrap removed.  Dressing:  You may change your dressing on Thursday                    Then change the dressing daily with sterile 4"x4"s gauze dressing                     And TED hose for knees.  Use paper tape to hold dressing in place                     For hips.  You may clean the incision with alcohol prior to redressing.  Weight Bearing:  Weight bearing as tolerated as taught in physical therapy.  Use a                                walker or Crutches as instructed.  To prevent constipation: you may use a stool softener such as -               Colace ( over the counter) 100 mg by mouth twice a day                Drink plenty of fluids ( prune juice may be helpful) and high fiber foods                Miralax ( over the counter) for constipation as needed.    Precautions:  If you experience chest pain or shortness of breath - call 911 immediately               For transfer to the hospital emergency department!!               If you develop a fever greater that 101 F, purulent drainage from wound,                             increased redness or drainage from wound, or calf pain -- Call the office at                                                 [phone].  Follow- Up Appointment:  Please call for an appointment to be seen on 02/18/12  [facility] - [phone]

## 2012-03-02 ENCOUNTER — Ambulatory Visit: Payer: BC Managed Care – PPO | Attending: Orthopedic Surgery | Admitting: Physical Therapy

## 2012-03-02 DIAGNOSIS — Z96659 Presence of unspecified artificial knee joint: Secondary | ICD-10-CM | POA: Insufficient documentation

## 2012-03-02 DIAGNOSIS — IMO0001 Reserved for inherently not codable concepts without codable children: Secondary | ICD-10-CM | POA: Insufficient documentation

## 2012-03-02 DIAGNOSIS — R262 Difficulty in walking, not elsewhere classified: Secondary | ICD-10-CM | POA: Insufficient documentation

## 2012-03-02 DIAGNOSIS — M25669 Stiffness of unspecified knee, not elsewhere classified: Secondary | ICD-10-CM | POA: Insufficient documentation

## 2012-03-02 DIAGNOSIS — M25569 Pain in unspecified knee: Secondary | ICD-10-CM | POA: Insufficient documentation

## 2012-03-05 ENCOUNTER — Ambulatory Visit: Payer: BC Managed Care – PPO | Admitting: Rehabilitation

## 2012-03-09 ENCOUNTER — Encounter: Payer: BC Managed Care – PPO | Admitting: Rehabilitation

## 2012-03-11 ENCOUNTER — Ambulatory Visit: Payer: BC Managed Care – PPO | Admitting: Physical Therapy

## 2012-03-16 ENCOUNTER — Ambulatory Visit: Payer: BC Managed Care – PPO | Admitting: Rehabilitation

## 2012-03-17 ENCOUNTER — Encounter: Payer: BC Managed Care – PPO | Admitting: Rehabilitation

## 2012-03-19 ENCOUNTER — Ambulatory Visit: Payer: BC Managed Care – PPO | Attending: Orthopedic Surgery | Admitting: Physical Therapy

## 2012-03-19 DIAGNOSIS — Z96659 Presence of unspecified artificial knee joint: Secondary | ICD-10-CM | POA: Insufficient documentation

## 2012-03-19 DIAGNOSIS — R262 Difficulty in walking, not elsewhere classified: Secondary | ICD-10-CM | POA: Insufficient documentation

## 2012-03-19 DIAGNOSIS — IMO0001 Reserved for inherently not codable concepts without codable children: Secondary | ICD-10-CM | POA: Insufficient documentation

## 2012-03-19 DIAGNOSIS — M25669 Stiffness of unspecified knee, not elsewhere classified: Secondary | ICD-10-CM | POA: Insufficient documentation

## 2012-03-19 DIAGNOSIS — M25569 Pain in unspecified knee: Secondary | ICD-10-CM | POA: Insufficient documentation

## 2012-03-23 ENCOUNTER — Ambulatory Visit: Payer: BC Managed Care – PPO | Admitting: Rehabilitation

## 2012-03-25 ENCOUNTER — Ambulatory Visit: Payer: BC Managed Care – PPO | Admitting: Rehabilitation

## 2012-03-30 ENCOUNTER — Ambulatory Visit: Payer: BC Managed Care – PPO | Admitting: Rehabilitation

## 2012-04-01 ENCOUNTER — Ambulatory Visit: Payer: BC Managed Care – PPO | Admitting: Rehabilitation

## 2012-04-06 ENCOUNTER — Ambulatory Visit: Payer: BC Managed Care – PPO | Admitting: Rehabilitation

## 2012-04-09 ENCOUNTER — Ambulatory Visit: Payer: BC Managed Care – PPO | Admitting: Rehabilitation

## 2012-04-13 ENCOUNTER — Ambulatory Visit: Payer: BC Managed Care – PPO | Admitting: Rehabilitation

## 2012-04-16 ENCOUNTER — Ambulatory Visit: Payer: BC Managed Care – PPO | Admitting: Rehabilitation

## 2012-04-21 ENCOUNTER — Ambulatory Visit: Payer: BC Managed Care – PPO | Attending: Orthopedic Surgery | Admitting: Rehabilitation

## 2012-04-21 DIAGNOSIS — Z96659 Presence of unspecified artificial knee joint: Secondary | ICD-10-CM | POA: Insufficient documentation

## 2012-04-21 DIAGNOSIS — M25569 Pain in unspecified knee: Secondary | ICD-10-CM | POA: Insufficient documentation

## 2012-04-21 DIAGNOSIS — IMO0001 Reserved for inherently not codable concepts without codable children: Secondary | ICD-10-CM | POA: Insufficient documentation

## 2012-04-21 DIAGNOSIS — M25669 Stiffness of unspecified knee, not elsewhere classified: Secondary | ICD-10-CM | POA: Insufficient documentation

## 2012-04-21 DIAGNOSIS — R262 Difficulty in walking, not elsewhere classified: Secondary | ICD-10-CM | POA: Insufficient documentation

## 2013-05-11 DIAGNOSIS — G8929 Other chronic pain: Secondary | ICD-10-CM | POA: Insufficient documentation

## 2013-05-11 DIAGNOSIS — M25579 Pain in unspecified ankle and joints of unspecified foot: Secondary | ICD-10-CM | POA: Insufficient documentation

## 2013-06-03 DIAGNOSIS — M19079 Primary osteoarthritis, unspecified ankle and foot: Secondary | ICD-10-CM | POA: Insufficient documentation

## 2014-02-28 DIAGNOSIS — K2100 Gastro-esophageal reflux disease with esophagitis, without bleeding: Secondary | ICD-10-CM | POA: Insufficient documentation

## 2014-02-28 DIAGNOSIS — K219 Gastro-esophageal reflux disease without esophagitis: Secondary | ICD-10-CM | POA: Insufficient documentation

## 2014-02-28 DIAGNOSIS — K581 Irritable bowel syndrome with constipation: Secondary | ICD-10-CM | POA: Insufficient documentation

## 2014-02-28 DIAGNOSIS — I10 Essential (primary) hypertension: Secondary | ICD-10-CM | POA: Insufficient documentation

## 2014-03-08 DIAGNOSIS — S92309A Fracture of unspecified metatarsal bone(s), unspecified foot, initial encounter for closed fracture: Secondary | ICD-10-CM | POA: Insufficient documentation

## 2014-05-23 DIAGNOSIS — M19071 Primary osteoarthritis, right ankle and foot: Secondary | ICD-10-CM | POA: Insufficient documentation

## 2014-07-18 DIAGNOSIS — K2270 Barrett's esophagus without dysplasia: Secondary | ICD-10-CM | POA: Insufficient documentation

## 2014-08-03 DIAGNOSIS — L209 Atopic dermatitis, unspecified: Secondary | ICD-10-CM | POA: Insufficient documentation

## 2014-11-11 DIAGNOSIS — E782 Mixed hyperlipidemia: Secondary | ICD-10-CM | POA: Insufficient documentation

## 2014-11-11 DIAGNOSIS — E785 Hyperlipidemia, unspecified: Secondary | ICD-10-CM | POA: Insufficient documentation

## 2014-11-11 DIAGNOSIS — D500 Iron deficiency anemia secondary to blood loss (chronic): Secondary | ICD-10-CM | POA: Insufficient documentation

## 2014-11-11 DIAGNOSIS — F4322 Adjustment disorder with anxiety: Secondary | ICD-10-CM | POA: Insufficient documentation

## 2014-11-11 DIAGNOSIS — M159 Polyosteoarthritis, unspecified: Secondary | ICD-10-CM | POA: Insufficient documentation

## 2014-12-02 DIAGNOSIS — T84039A Mechanical loosening of unspecified internal prosthetic joint, initial encounter: Secondary | ICD-10-CM | POA: Insufficient documentation

## 2015-03-03 DIAGNOSIS — K5900 Constipation, unspecified: Secondary | ICD-10-CM | POA: Insufficient documentation

## 2015-03-03 DIAGNOSIS — D649 Anemia, unspecified: Secondary | ICD-10-CM | POA: Insufficient documentation

## 2015-08-16 DIAGNOSIS — Z9660 Presence of unspecified orthopedic joint implant: Secondary | ICD-10-CM | POA: Insufficient documentation

## 2015-08-30 DIAGNOSIS — E039 Hypothyroidism, unspecified: Secondary | ICD-10-CM | POA: Insufficient documentation

## 2015-08-30 DIAGNOSIS — E890 Postprocedural hypothyroidism: Secondary | ICD-10-CM | POA: Insufficient documentation

## 2015-08-30 DIAGNOSIS — R079 Chest pain, unspecified: Secondary | ICD-10-CM | POA: Insufficient documentation

## 2015-09-16 DIAGNOSIS — N3000 Acute cystitis without hematuria: Secondary | ICD-10-CM | POA: Insufficient documentation

## 2015-11-03 DIAGNOSIS — Z23 Encounter for immunization: Secondary | ICD-10-CM | POA: Insufficient documentation

## 2015-11-27 DIAGNOSIS — Z96669 Presence of unspecified artificial ankle joint: Secondary | ICD-10-CM | POA: Insufficient documentation

## 2015-11-27 DIAGNOSIS — Z471 Aftercare following joint replacement surgery: Secondary | ICD-10-CM | POA: Insufficient documentation

## 2015-12-09 DIAGNOSIS — C44311 Basal cell carcinoma of skin of nose: Secondary | ICD-10-CM | POA: Insufficient documentation

## 2016-05-21 ENCOUNTER — Emergency Department (HOSPITAL_COMMUNITY): Payer: Worker's Compensation

## 2016-05-21 ENCOUNTER — Encounter (HOSPITAL_COMMUNITY): Payer: Self-pay | Admitting: Emergency Medicine

## 2016-05-21 ENCOUNTER — Emergency Department (HOSPITAL_COMMUNITY)
Admission: EM | Admit: 2016-05-21 | Discharge: 2016-05-21 | Disposition: A | Discharge status: Home / Self Care | Payer: Worker's Compensation | Attending: Emergency Medicine | Admitting: Emergency Medicine

## 2016-05-21 DIAGNOSIS — Y999 Unspecified external cause status: Secondary | ICD-10-CM | POA: Insufficient documentation

## 2016-05-21 DIAGNOSIS — W19XXXA Unspecified fall, initial encounter: Secondary | ICD-10-CM

## 2016-05-21 DIAGNOSIS — S0181XA Laceration without foreign body of other part of head, initial encounter: Secondary | ICD-10-CM | POA: Diagnosis not present

## 2016-05-21 DIAGNOSIS — Z7982 Long term (current) use of aspirin: Secondary | ICD-10-CM | POA: Insufficient documentation

## 2016-05-21 DIAGNOSIS — Y929 Unspecified place or not applicable: Secondary | ICD-10-CM | POA: Diagnosis not present

## 2016-05-21 DIAGNOSIS — I10 Essential (primary) hypertension: Secondary | ICD-10-CM | POA: Diagnosis not present

## 2016-05-21 DIAGNOSIS — Y9389 Activity, other specified: Secondary | ICD-10-CM | POA: Diagnosis not present

## 2016-05-21 DIAGNOSIS — Z79899 Other long term (current) drug therapy: Secondary | ICD-10-CM | POA: Insufficient documentation

## 2016-05-21 DIAGNOSIS — S0990XA Unspecified injury of head, initial encounter: Secondary | ICD-10-CM | POA: Diagnosis present

## 2016-05-21 DIAGNOSIS — Z87891 Personal history of nicotine dependence: Secondary | ICD-10-CM | POA: Insufficient documentation

## 2016-05-21 DIAGNOSIS — E039 Hypothyroidism, unspecified: Secondary | ICD-10-CM | POA: Diagnosis not present

## 2016-05-21 DIAGNOSIS — W01198A Fall on same level from slipping, tripping and stumbling with subsequent striking against other object, initial encounter: Secondary | ICD-10-CM | POA: Diagnosis not present

## 2016-05-21 HISTORY — DX: Irritable bowel syndrome, unspecified: K58.9

## 2016-05-21 MED ORDER — LIDOCAINE HCL (PF) 1 % IJ SOLN
5.0000 mL | Freq: Once | INTRAMUSCULAR | Status: AC
  Administered 2016-05-21: 5 mL
  Filled 2016-05-21: qty 30

## 2016-05-21 MED ORDER — ACETAMINOPHEN 500 MG PO TABS
1000.0000 mg | ORAL_TABLET | Freq: Once | ORAL | Status: AC
  Administered 2016-05-21: 1000 mg via ORAL
  Filled 2016-05-21: qty 2

## 2016-05-21 MED ORDER — BACITRACIN ZINC 500 UNIT/GM EX OINT
TOPICAL_OINTMENT | Freq: Two times a day (BID) | CUTANEOUS | Status: DC
  Administered 2016-05-21: 1 via TOPICAL
  Filled 2016-05-21: qty 0.9

## 2016-05-21 NOTE — ED Triage Notes (Signed)
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 Lac to L forehead. Pt denies LOC, c/o headache.

## 2016-05-21 NOTE — Discharge Instructions (Signed)
Read the information below.  You may return to the Emergency Department at any time for worsening condition or any new symptoms that concern you.  ° °You have had a head injury which does not appear to require admission at this time. A concussion is a state of changed mental ability from trauma. °SEEK IMMEDIATE MEDICAL ATTENTION IF: °There is confusion or drowsiness (although children frequently become drowsy after injury).  °You cannot awaken the injured person.  °There is nausea (feeling sick to your stomach) or continued, forceful vomiting.  °You notice dizziness or unsteadiness which is getting worse, or inability to walk.  °You have convulsions or unconsciousness.  °You experience severe, persistent headaches not relieved by Tylenol?. (Do not take aspirin as this impairs clotting abilities). Take other pain medications only as directed.  °You cannot use arms or legs normally.  °There are changes in pupil sizes. (This is the black center in the colored part of the eye)  °There is clear or bloody discharge from the nose or ears.  °Change in speech, vision, swallowing, or understanding.  °Localized weakness, numbness, tingling, or change in bowel or bladder control.  °

## 2016-05-21 NOTE — Progress Notes (Signed)
CSW spoke with patient at bedside with no one present. 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 Patient reports she hit her head on a desk. Patient reports she completes her on ADL's and she does not fall often. Patient reports she lives with her husband Rhae HammockMark Beaumier and her 58twenty[age]three year old son. No questions noted for CSW at this time.  Elenore PaddyLaVonia Wil Slape, LCSWA [phone] ED CSW 05/21/2016 12:50 PM

## 2016-05-21 NOTE — ED Notes (Signed)
Bed: [id number] Expected date:  Expected time:  Means of arrival:  Comments: EMS- 58s F, fall, head lac

## 2016-05-21 NOTE — ED Notes (Signed)
Pt with bruise and pain to L shoulder.
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
## 2016-06-21 DIAGNOSIS — Z79899 Other long term (current) drug therapy: Secondary | ICD-10-CM | POA: Insufficient documentation

## 2016-06-21 DIAGNOSIS — K625 Hemorrhage of anus and rectum: Secondary | ICD-10-CM | POA: Insufficient documentation

## 2016-08-27 DIAGNOSIS — R1030 Lower abdominal pain, unspecified: Secondary | ICD-10-CM | POA: Insufficient documentation

## 2016-09-01 ENCOUNTER — Emergency Department (HOSPITAL_COMMUNITY): Payer: BC Managed Care – PPO

## 2016-09-01 ENCOUNTER — Encounter (HOSPITAL_COMMUNITY): Payer: Self-pay

## 2016-09-01 ENCOUNTER — Emergency Department (HOSPITAL_COMMUNITY)
Admission: EM | Admit: 2016-09-01 | Discharge: 2016-09-01 | Disposition: A | Discharge status: Home / Self Care | Payer: BC Managed Care – PPO | Attending: Emergency Medicine | Admitting: Emergency Medicine

## 2016-09-01 DIAGNOSIS — E039 Hypothyroidism, unspecified: Secondary | ICD-10-CM | POA: Insufficient documentation

## 2016-09-01 DIAGNOSIS — R079 Chest pain, unspecified: Secondary | ICD-10-CM

## 2016-09-01 DIAGNOSIS — Z87891 Personal history of nicotine dependence: Secondary | ICD-10-CM | POA: Insufficient documentation

## 2016-09-01 DIAGNOSIS — R0789 Other chest pain: Secondary | ICD-10-CM | POA: Diagnosis present

## 2016-09-01 DIAGNOSIS — I10 Essential (primary) hypertension: Secondary | ICD-10-CM | POA: Diagnosis not present

## 2016-09-01 DIAGNOSIS — Z7982 Long term (current) use of aspirin: Secondary | ICD-10-CM | POA: Diagnosis not present

## 2016-09-01 DIAGNOSIS — Z96653 Presence of artificial knee joint, bilateral: Secondary | ICD-10-CM | POA: Diagnosis not present

## 2016-09-01 LAB — BASIC METABOLIC PANEL
Anion gap: 9 (ref 5–15)
BUN: 22 mg/dL — ABNORMAL HIGH (ref 6–20)
CO2: 27 mmol/L (ref 22–32)
Calcium: 8.7 mg/dL — ABNORMAL LOW (ref 8.9–10.3)
Chloride: 102 mmol/L (ref 101–111)
Creatinine, Ser: 0.98 mg/dL (ref 0.44–1.00)
GFR calc Af Amer: >60 mL/min (ref >60)
GFR calc non Af Amer: 58 mL/min — ABNORMAL LOW (ref >60)
Glucose, Bld: 109 mg/dL — ABNORMAL HIGH (ref 65–99)
Potassium: 3.8 mmol/L (ref 3.5–5.1)
Sodium: 138 mmol/L (ref 135–145)

## 2016-09-01 LAB — CBC
HCT: 35.3 % — ABNORMAL LOW (ref 36.0–46.0)
Hemoglobin: 11.6 g/dL — ABNORMAL LOW (ref 12.0–15.0)
MCH: 30.8 pg (ref 26.0–34.0)
MCHC: 32.9 g/dL (ref 30.0–36.0)
MCV: 93.6 fL (ref 78.0–100.0)
Platelets: 273 10*3/uL (ref 150–400)
RBC: 3.77 MIL/uL — ABNORMAL LOW (ref 3.87–5.11)
RDW: 13.6 % (ref 11.5–15.5)
WBC: 6.6 10*3/uL (ref 4.0–10.5)

## 2016-09-01 LAB — I-STAT TROPONIN, ED: Troponin i, poc: 0.01 ng/mL (ref 0.00–0.08)

## 2016-09-01 NOTE — Discharge Instructions (Signed)
Please follow-up with your PCP. I think the next step from hear is to obtain a stress test.
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
## 2016-09-01 NOTE — ED Triage Notes (Addendum)
Pt presents with c/o chest pain that started several hours ago. Pt reports that the pain has now resolved but when she had the pain, it lasted for approx 12 minutes. Pt went to UC and had an EKG and was referred to the ER to have her cardiac enzymes checked. Pt reports the pain was in the center of her chest, crushing and stabbing in nature and also reported some left jaw pain as well as lightheadedness.

## 2016-10-16 ENCOUNTER — Encounter: Payer: Self-pay | Admitting: Family Medicine

## 2017-02-10 DIAGNOSIS — M542 Cervicalgia: Secondary | ICD-10-CM | POA: Insufficient documentation

## 2017-09-25 IMAGING — CT CT HEAD W/O CM
3 of 8 series · 15 of 47 positions shown, 18 images · non-contrast
Comparison: None.

CLINICAL DATA: Fall, hit head.  Laceration to left forehead.

EXAM:
CT HEAD WITHOUT CONTRAST
TECHNIQUE: Contiguous axial images were obtained from the base of the skull
through the vertex without intravenous contrast.

[Series 6: coronal · coronal · 0.26mm/px · 3 of 61 slices shown]
[im 18/61  brain]
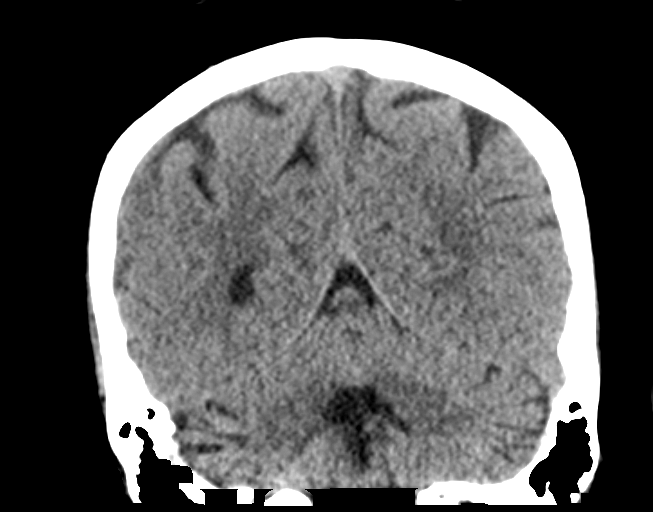
[im 26/61  brain]
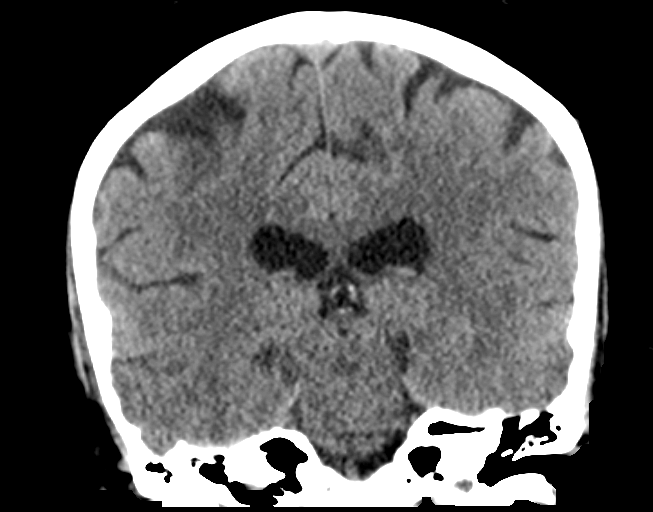
[im 35/61  brain]
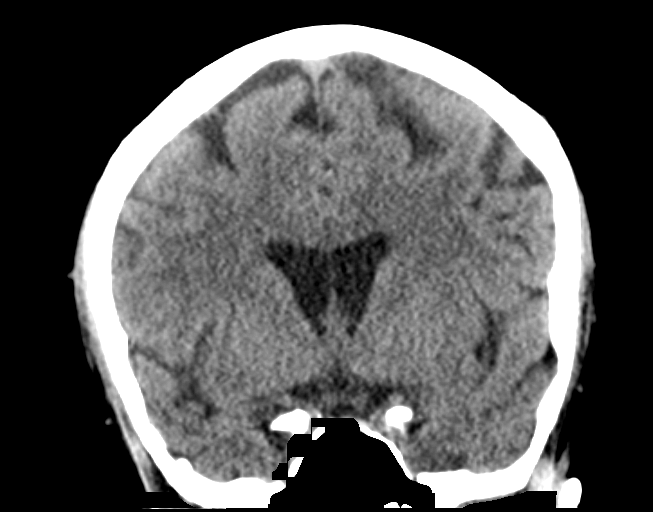

[Series 11: axial recon · axial · 0.23mm/px · z∈[-253,-91]mm · 10 of 105 slices shown, 13 images]
[im 9/105  brain]
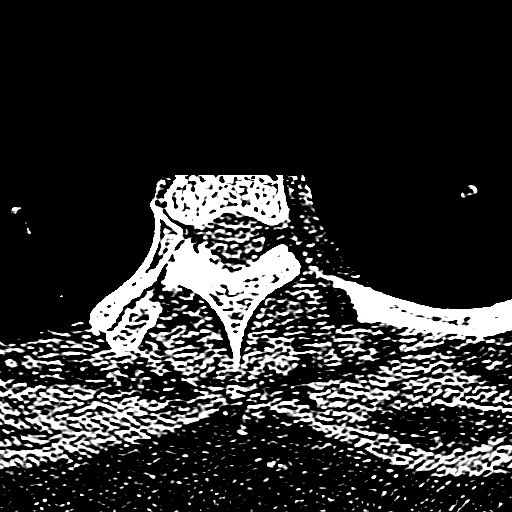
[im 9/105  bone]
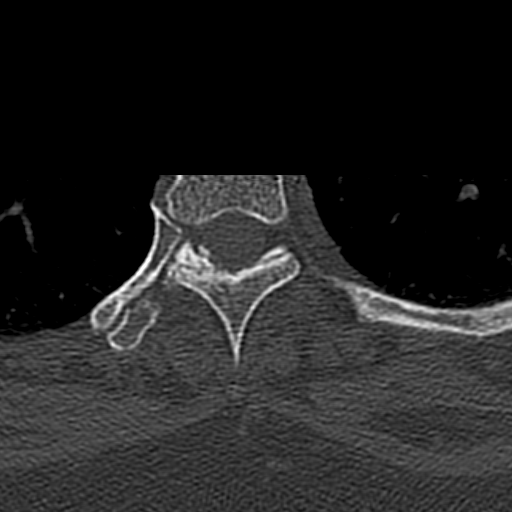
[im 18/105  brain]
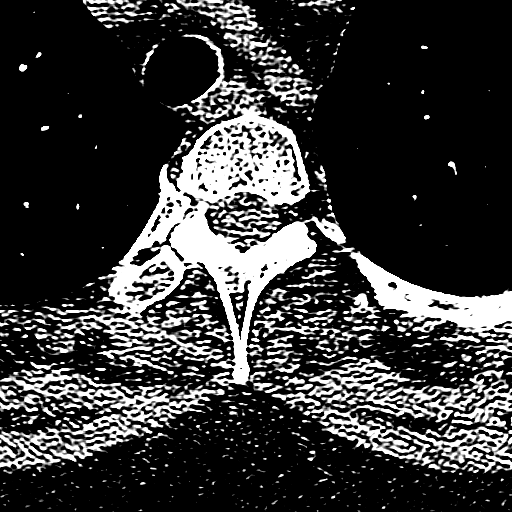
[im 27/105  brain]
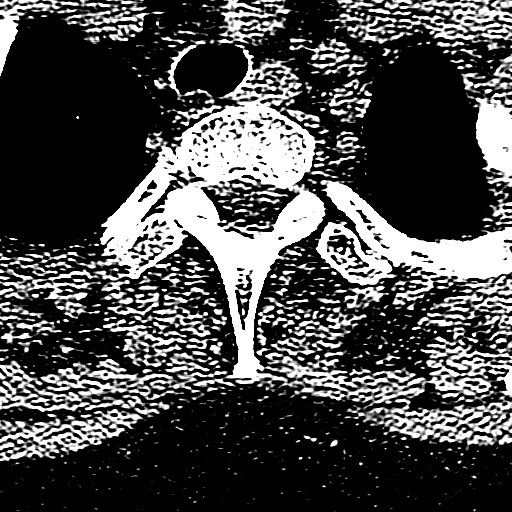
[im 35/105  brain]
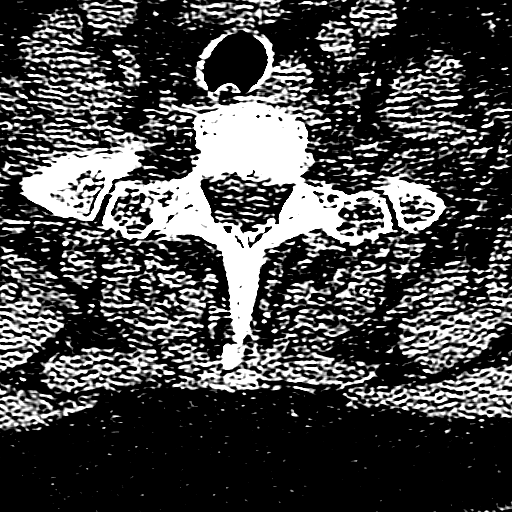
[im 44/105  brain]
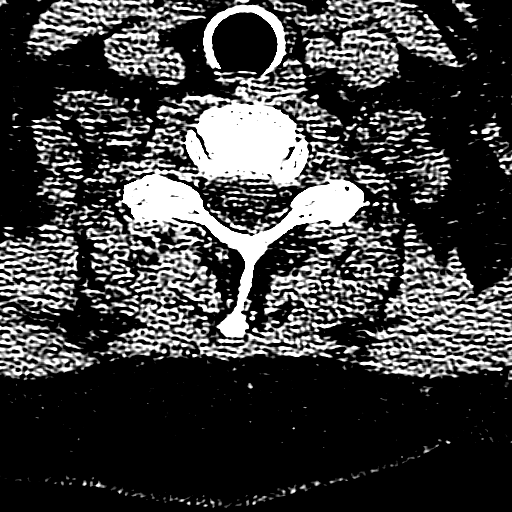
[im 44/105  bone]
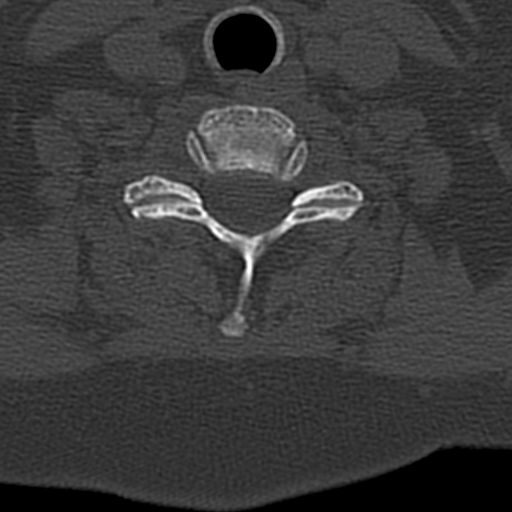
[im 61/105  brain]
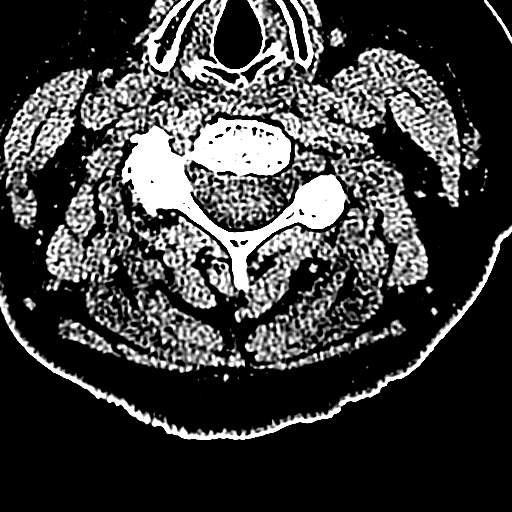
[im 70/105  brain]
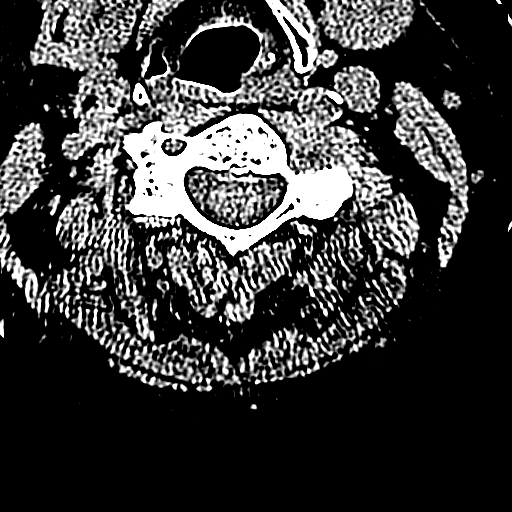
[im 79/105  brain]
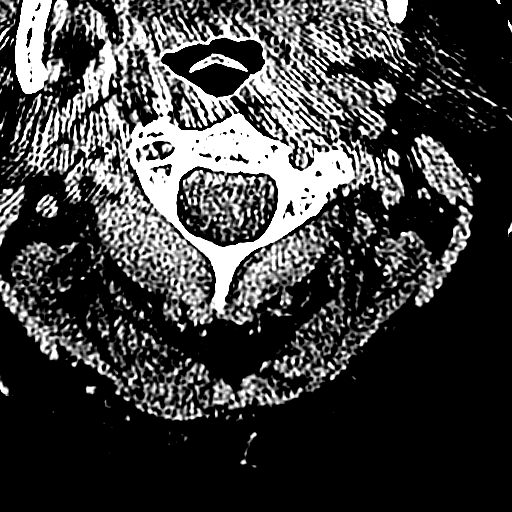
[im 87/105  brain]
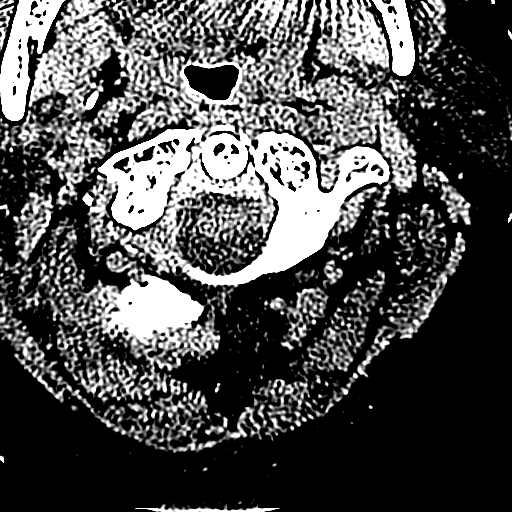
[im 87/105  bone]
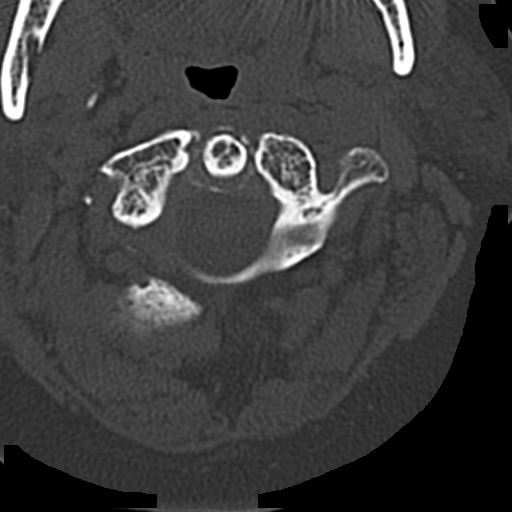
[im 96/105  brain]
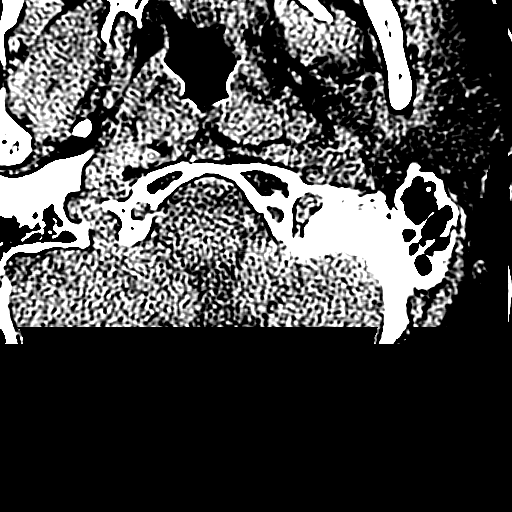

[Series 13: sagittal · sagittal · 0.25mm/px · 2 of 61 slices shown]
[im 21/61  brain]
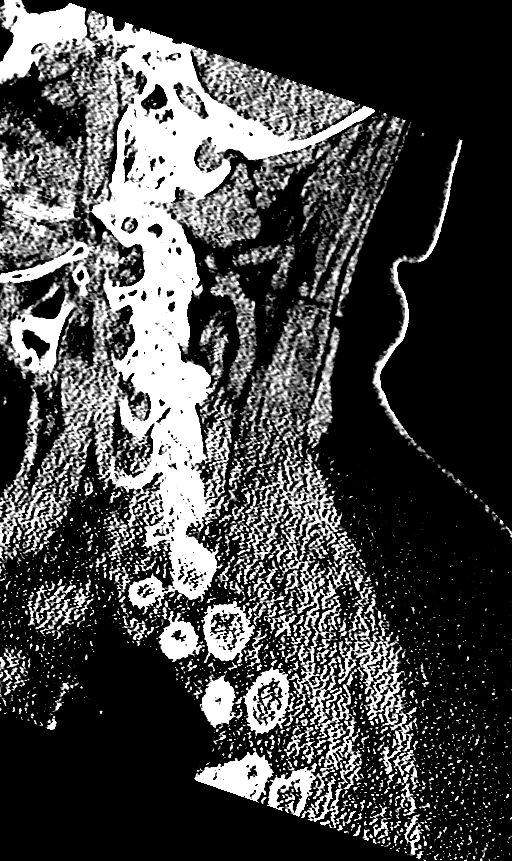
[im 41/61  brain]
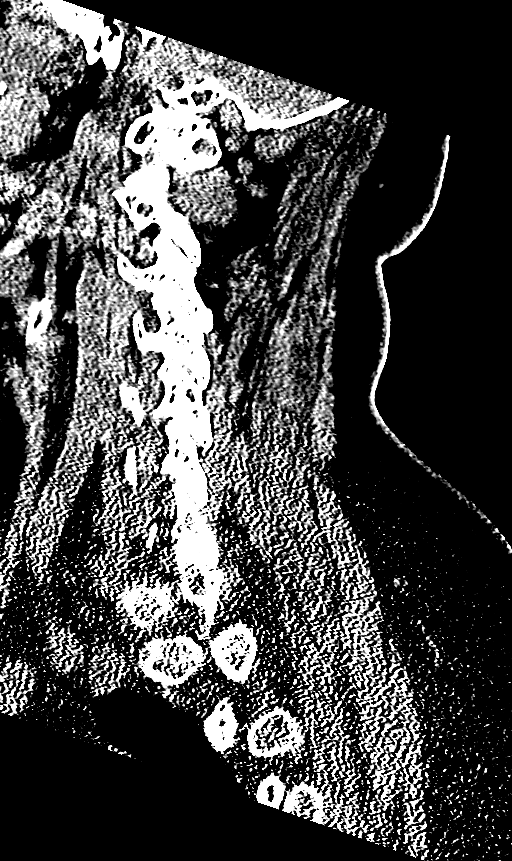

[15 of 47 positions shown; findings below may reference images not displayed]

FINDINGS: Brain: No acute intracranial abnormality. Specifically, no
hemorrhage, hydrocephalus, mass lesion, acute infarction, or
significant intracranial injury.

Vascular: No hyperdense vessel or unexpected calcification.

Skull: No acute calvarial abnormality.

Sinuses/Orbits: Visualized paranasal sinuses and mastoids clear.
Orbital soft tissues unremarkable.

Other: None
IMPRESSION: Negative study.

## 2017-09-25 IMAGING — CR DG CHEST 2V
2 series · 2 of 2 positions shown · non-contrast
Comparison: 01/29/2012

CLINICAL DATA: Fall and chest pain.  Initial encounter.

EXAM:
CHEST  2 VIEW

[w chest pa]
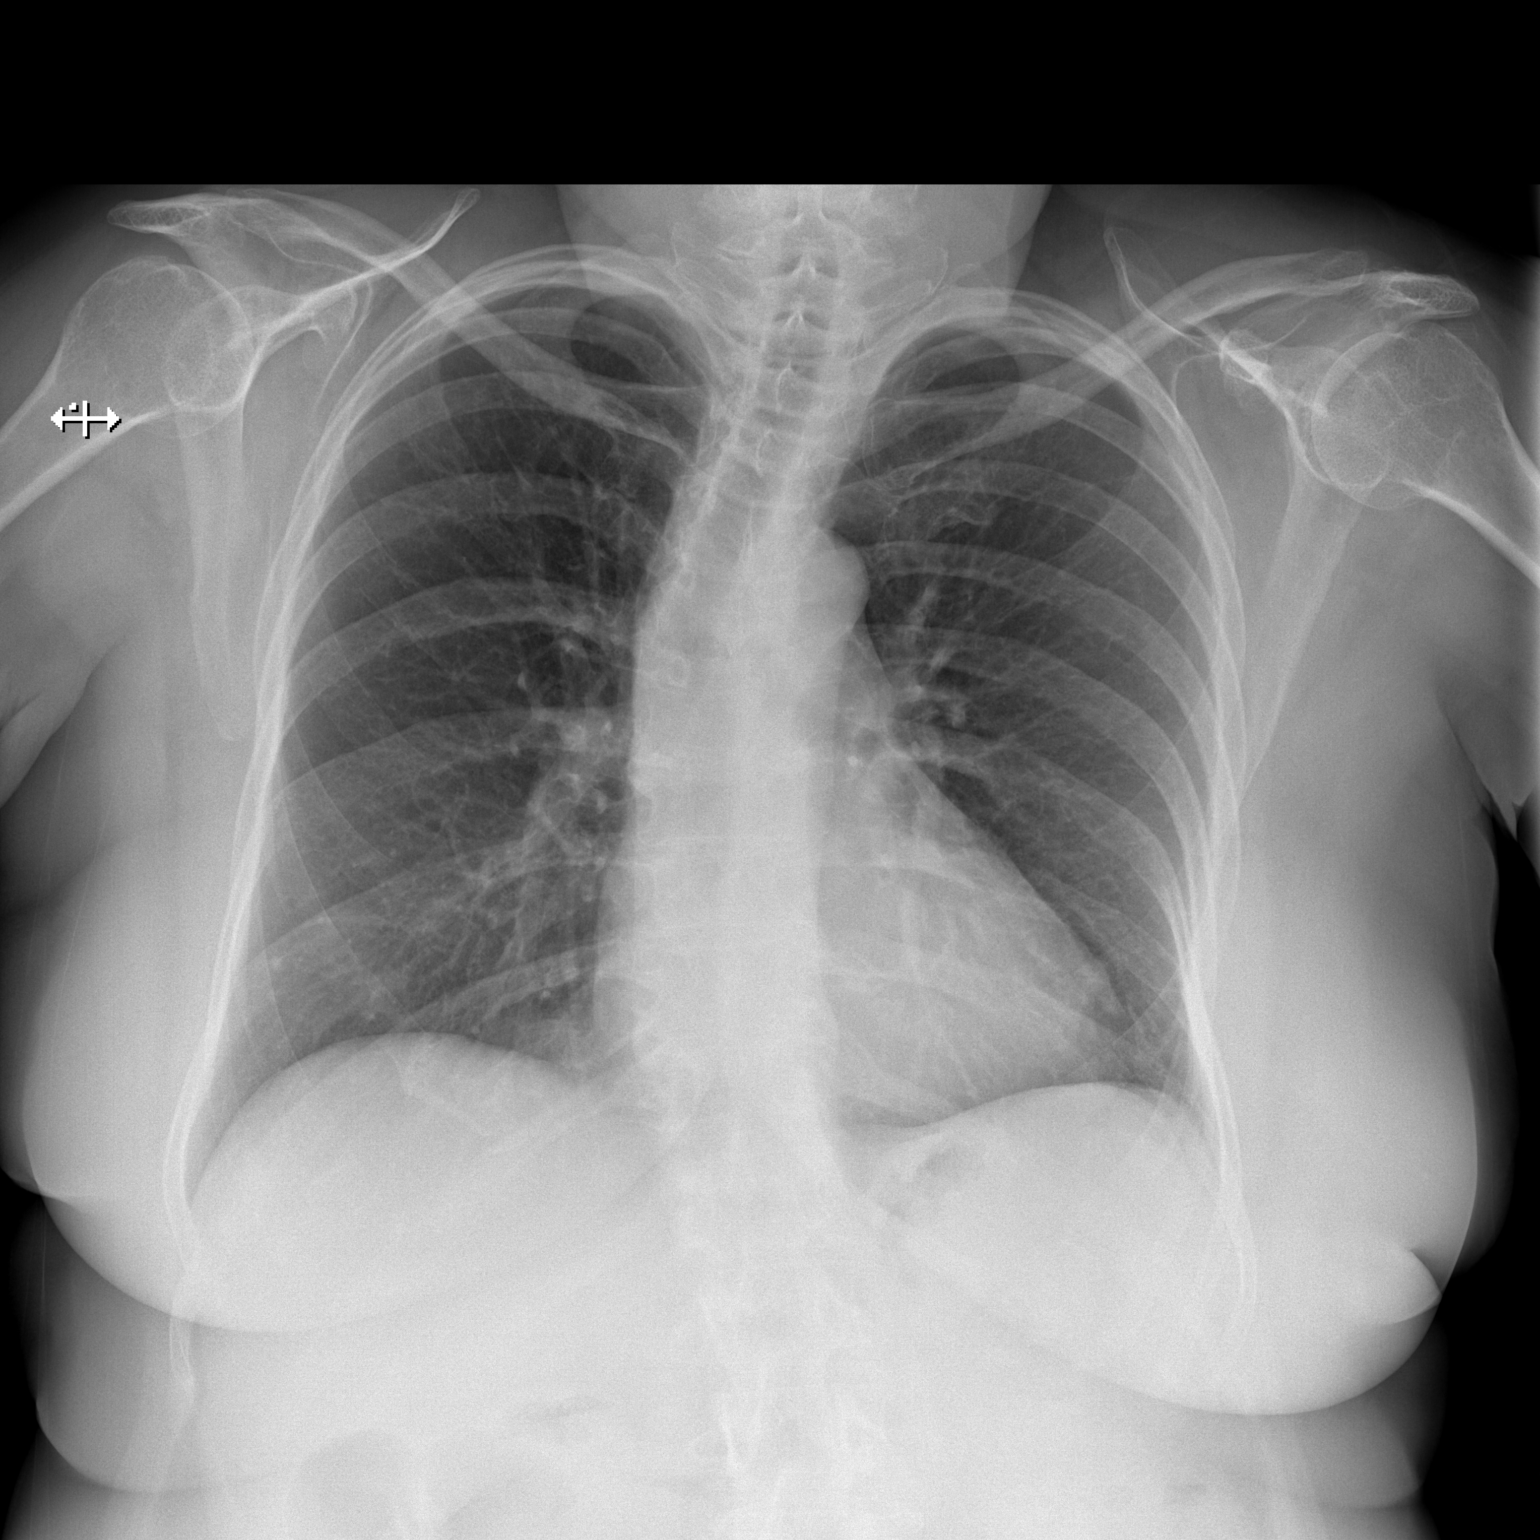

[w chest lat]
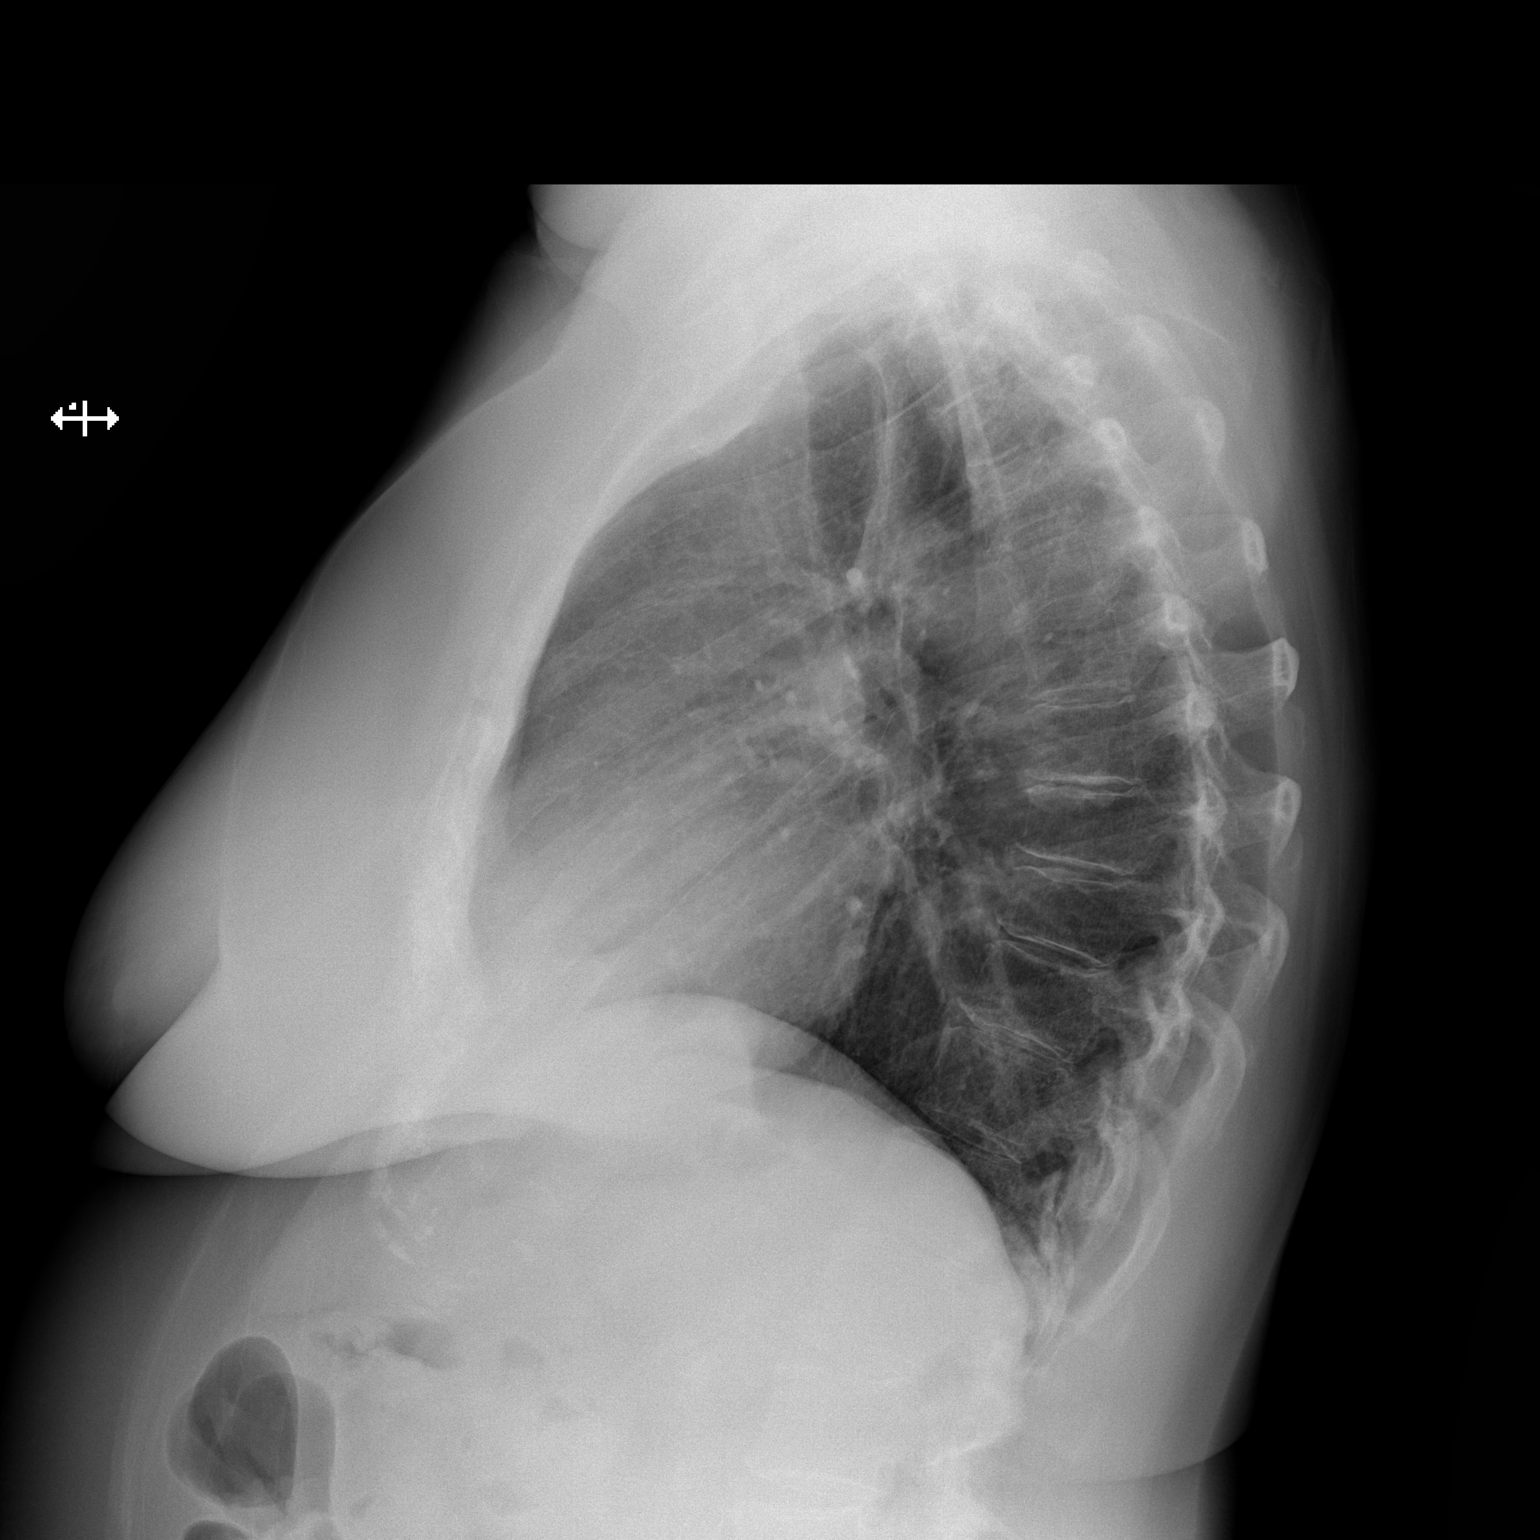

[2 of 2 positions shown; findings below may reference images not displayed]

FINDINGS: The heart size and mediastinal contours are within normal limits.
There is no evidence of pulmonary edema, consolidation,
pneumothorax, nodule or pleural fluid. Stable thoracic scoliosis. No
fractures visualized.
IMPRESSION: No active cardiopulmonary disease.

## 2017-11-25 DIAGNOSIS — M8589 Other specified disorders of bone density and structure, multiple sites: Secondary | ICD-10-CM | POA: Insufficient documentation

## 2017-12-10 DIAGNOSIS — M19072 Primary osteoarthritis, left ankle and foot: Secondary | ICD-10-CM | POA: Insufficient documentation

## 2019-04-01 DIAGNOSIS — I2089 Other forms of angina pectoris: Secondary | ICD-10-CM | POA: Insufficient documentation

## 2019-09-10 ENCOUNTER — Ambulatory Visit: Payer: Medicare Other | Attending: Internal Medicine

## 2019-09-10 DIAGNOSIS — Z23 Encounter for immunization: Secondary | ICD-10-CM | POA: Insufficient documentation

## 2019-09-10 NOTE — Progress Notes (Signed)
   Covid-19 Vaccination Clinic  Name:  PEPPER KERRICK    MRN: [id number] DOB: Jan 02, 1951  09/10/2019  Ms. Lyles was observed post Covid-19 immunization for 15 minutes without incidence. She was provided with Vaccine Information Sheet and instruction to access the V-Safe system.   Ms. Mapel was instructed to call 911 with any severe reactions post vaccine: Marland Kitchen Difficulty breathing  . Swelling of your face and throat  . A fast heartbeat  . A bad rash all over your body  . Dizziness and weakness    Immunizations Administered    Name Date Dose VIS Date Route   Pfizer COVID-19 Vaccine 09/10/2019  1:52 PM 0.3 mL 07/30/2019 Intramuscular   Manufacturer: [employer], [employer]   Lot: [id number]   NDC: [address]-4

## 2019-10-01 ENCOUNTER — Ambulatory Visit: Payer: Medicare Other | Attending: Internal Medicine

## 2019-10-01 DIAGNOSIS — Z23 Encounter for immunization: Secondary | ICD-10-CM | POA: Insufficient documentation

## 2019-10-01 NOTE — Progress Notes (Signed)
   Covid-19 Vaccination Clinic  Name:  EDYTH GLOMB    MRN: [id number] DOB: Jun 06, 1951  10/01/2019  Ms. Chargois was observed post Covid-19 immunization for 15 minutes without incidence. She was provided with Vaccine Information Sheet and instruction to access the V-Safe system.   Ms. Weldin was instructed to call 911 with any severe reactions post vaccine: Marland Kitchen Difficulty breathing  . Swelling of your face and throat  . A fast heartbeat  . A bad rash all over your body  . Dizziness and weakness    Immunizations Administered    Name Date Dose VIS Date Route   Pfizer COVID-19 Vaccine 10/01/2019 12:39 AM 0.3 mL 07/30/2019 Intramuscular   Manufacturer: [employer], [employer]   Lot: [id number]   NDC: [address]-0

## 2020-06-01 ENCOUNTER — Ambulatory Visit: Payer: Medicare Other | Admitting: Pulmonary Disease

## 2020-06-01 ENCOUNTER — Other Ambulatory Visit: Payer: Self-pay

## 2020-06-01 ENCOUNTER — Encounter: Payer: Self-pay | Admitting: Pulmonary Disease

## 2020-06-01 VITALS — BP 136/62 | HR 90 | Temp 97.4°F | Ht 65.0 in | Wt 172.0 lb

## 2020-06-01 DIAGNOSIS — R918 Other nonspecific abnormal finding of lung field: Secondary | ICD-10-CM | POA: Diagnosis not present

## 2020-06-01 DIAGNOSIS — R0602 Shortness of breath: Secondary | ICD-10-CM

## 2020-06-01 DIAGNOSIS — R0600 Dyspnea, unspecified: Secondary | ICD-10-CM | POA: Diagnosis not present

## 2020-06-01 DIAGNOSIS — R0609 Other forms of dyspnea: Secondary | ICD-10-CM
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
## 2020-06-01 NOTE — Patient Instructions (Addendum)
Thank you for visiting Dr. Tonia Brooms at [facility]. Today we recommend the following:  Orders Placed This Encounter  Procedures  . CT Chest Wo Contrast  . Pulmonary Function Test   Return in about 6 weeks (around 07/13/2020) for with APP. Please schedule PFTs prior to appt for review.   CT Chest to be scheduled in 12 months   Please bring the reports of your stress test with your next office visit.    Please do your part to reduce the spread of COVID-19.

## 2020-06-16 DIAGNOSIS — R9439 Abnormal result of other cardiovascular function study: Secondary | ICD-10-CM | POA: Insufficient documentation

## 2020-07-18 ENCOUNTER — Encounter: Payer: Self-pay | Admitting: Adult Health

## 2020-07-18 ENCOUNTER — Ambulatory Visit (INDEPENDENT_AMBULATORY_CARE_PROVIDER_SITE_OTHER): Payer: Medicare Other | Admitting: Pulmonary Disease

## 2020-07-18 ENCOUNTER — Ambulatory Visit: Payer: Medicare Other | Admitting: Adult Health

## 2020-07-18 ENCOUNTER — Other Ambulatory Visit: Payer: Self-pay

## 2020-07-18 DIAGNOSIS — R0602 Shortness of breath: Secondary | ICD-10-CM

## 2020-07-18 DIAGNOSIS — R0600 Dyspnea, unspecified: Secondary | ICD-10-CM

## 2020-07-18 DIAGNOSIS — R0609 Other forms of dyspnea: Secondary | ICD-10-CM

## 2020-07-18 DIAGNOSIS — R918 Other nonspecific abnormal finding of lung field: Secondary | ICD-10-CM

## 2020-07-18 LAB — PULMONARY FUNCTION TEST
DL/VA % pred: 106 %
DL/VA: 4.38 ml/min/mmHg/L
DLCO cor % pred: 90 %
DLCO cor: 18.21 ml/min/mmHg
DLCO unc % pred: 90 %
DLCO unc: 18.21 ml/min/mmHg
FEF 25-75 Post: 3.54 L/sec
FEF 25-75 Pre: 2.87 L/sec
FEF2575-%Change-Post: 23 %
FEF2575-%Pred-Post: 185 %
FEF2575-%Pred-Pre: 150 %
FEV1-%Change-Post: 4 %
FEV1-%Pred-Post: 101 %
FEV1-%Pred-Pre: 97 %
FEV1-Post: 2.37 L
FEV1-Pre: 2.28 L
FEV1FVC-%Change-Post: 6 %
FEV1FVC-%Pred-Pre: 110 %
FEV6-%Change-Post: 0 %
FEV6-%Pred-Post: 90 %
FEV6-%Pred-Pre: 91 %
FEV6-Post: 2.67 L
FEV6-Pre: 2.69 L
FEV6FVC-%Change-Post: 0 %
FEV6FVC-%Pred-Post: 104 %
FEV6FVC-%Pred-Pre: 104 %
FVC-%Change-Post: -1 %
FVC-%Pred-Post: 86 %
FVC-%Pred-Pre: 88 %
FVC-Post: 2.67 L
FVC-Pre: 2.72 L
Post FEV1/FVC ratio: 89 %
Post FEV6/FVC ratio: 100 %
Pre FEV1/FVC ratio: 84 %
Pre FEV6/FVC Ratio: 100 %
RV % pred: 83 %
RV: 1.9 L
TLC % pred: 88 %
TLC: 4.58 L

## 2020-07-18 NOTE — Patient Instructions (Addendum)
CT chest as planned for next year.  Activity as tolerated.  Follow up with Cardiology as needed.  May remain off BREO .  Use Albuterol Inhaler as needed for shortness of breath  Follow up with Dr Tonia Brooms or Nthony Lefferts NP in 6 months and As needed

## 2020-07-18 NOTE — Progress Notes (Signed)
PFT done today. 
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
## 2020-07-19 DIAGNOSIS — R918 Other nonspecific abnormal finding of lung field: Secondary | ICD-10-CM | POA: Insufficient documentation

## 2020-07-19 DIAGNOSIS — R0600 Dyspnea, unspecified: Secondary | ICD-10-CM | POA: Insufficient documentation

## 2020-07-19 NOTE — Assessment & Plan Note (Signed)
Dyspnea with activity over the last 4 to 5 months.  This seems to be improved after gentle diuresis.  Pulmonary function testing shows no evidence of airflow obstruction or restriction.  Lung function is normal.  O2 saturation is 97% on room air.  Nothing on CT chest to indicate etiology for underlying dyspnea.  Suspect may be secondary to some mild fluid overload as she had clinical improvement with Lasix.  Have asked her to follow back up with cardiology.  No evidence of overt volume overload on exam. Patient also may have a component of some deconditioning as well. Does not appear to have any underlying asthma or COPD.  May discontinue Breo.  And just use albuterol as needed Plan   Patient Instructions  CT chest as planned for next year.  Activity as tolerated.  Follow up with Cardiology as needed.  May remain off BREO .  Use Albuterol Inhaler as needed for shortness of breath  Follow up with Dr Tonia Brooms or Jamaya Sleeth NP in 6 months and As needed

## 2020-07-19 NOTE — Assessment & Plan Note (Signed)
Multiple pulmonary nodules noted measuring 2 to 4 mm.  CT chest is planned in 1 year follow-up.

## 2020-07-21 NOTE — Progress Notes (Signed)
PCCM: thanks for seeing her.  BLI

## 2020-10-12 ENCOUNTER — Encounter: Payer: Self-pay | Admitting: Podiatry

## 2020-10-12 ENCOUNTER — Other Ambulatory Visit: Payer: Self-pay

## 2020-10-12 ENCOUNTER — Ambulatory Visit: Payer: Medicare Other | Admitting: Podiatry

## 2020-10-12 VITALS — BP 138/71 | HR 78 | Temp 98.2°F

## 2020-10-12 DIAGNOSIS — M779 Enthesopathy, unspecified: Secondary | ICD-10-CM | POA: Diagnosis not present

## 2020-10-12 DIAGNOSIS — L84 Corns and callosities: Secondary | ICD-10-CM

## 2020-10-12 DIAGNOSIS — M21621 Bunionette of right foot: Secondary | ICD-10-CM

## 2020-10-15 NOTE — Progress Notes (Signed)
Subjective:   Patient ID: [name], female   DOB: 70 y.o.   MRN: [id number]   HPI Patient presents stating she has a lot of pain in the outside of the right foot and also has lesions on both feet that are painful and make it hard for her to walk comfortably. Patient does not smoke and would like to be more active   Review of Systems  All other systems reviewed and are negative.       Objective:  Physical Exam Vitals and nursing note reviewed.  Constitutional:      Appearance: She is well-developed and well-nourished.  Cardiovascular:     Pulses: Intact distal pulses.  Pulmonary:     Effort: Pulmonary effort is normal.  Musculoskeletal:        General: Normal range of motion.  Skin:    General: Skin is warm.  Neurological:     Mental Status: She is alert.     Neurovascular status intact muscle strength found to be adequate range of motion adequate. Patient is noted to have moderate depression of the arch has inflammation fluid around the fifth MPJ right and has chronic callus formation on the first fifth metatarsal both feet painful when pressed making walking difficult. Patient has good digital perfusion well oriented x3     Assessment:  Inflammation of the fifth MPJ right with fluid buildup along with lesion formation bilateral painful when pressed     Plan:  H&P reviewed condition and discussed x-ray findings. Today I did sterile prep injected the fifth MPJ 3 mg dexamethasone Kenalog 5 mg Xylocaine and went ahead and did sterile sharp debridement of all lesions with no iatrogenic bleeding noted. Patient will be seen back as needed may require bony resection at 1 point in future but will hold on currently  X-rays indicate moderate structural deformity bilateral and indicates no indications advanced arthritis or stress fracture

## 2021-01-17 ENCOUNTER — Encounter: Payer: Self-pay | Admitting: Pulmonary Disease

## 2021-01-17 ENCOUNTER — Other Ambulatory Visit: Payer: Self-pay

## 2021-01-17 ENCOUNTER — Ambulatory Visit: Payer: Medicare Other | Admitting: Pulmonary Disease

## 2021-01-17 VITALS — BP 140/68 | HR 73 | Temp 97.6°F | Ht 65.0 in | Wt 165.0 lb

## 2021-01-17 DIAGNOSIS — I5032 Chronic diastolic (congestive) heart failure: Secondary | ICD-10-CM

## 2021-01-17 DIAGNOSIS — R918 Other nonspecific abnormal finding of lung field: Secondary | ICD-10-CM | POA: Diagnosis not present

## 2021-01-17 DIAGNOSIS — R0600 Dyspnea, unspecified: Secondary | ICD-10-CM | POA: Diagnosis not present

## 2021-01-17 DIAGNOSIS — R0609 Other forms of dyspnea: Secondary | ICD-10-CM

## 2021-01-17 NOTE — Patient Instructions (Addendum)
Thank you for visiting Dr. Tonia Brooms at [facility]. Today we recommend the following:  Continue what you are doing!  Walking, loosing weight, remains important  We will watch your nodules Repeat ct chest in October 2022  Return in about 5 months (around 06/19/2021) for with APP or Dr. Tonia Brooms.    Please do your part to reduce the spread of COVID-19.
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
## 2021-04-20 ENCOUNTER — Telehealth: Payer: Self-pay | Admitting: Pulmonary Disease

## 2021-04-20 NOTE — Telephone Encounter (Signed)
PT aware the PCCs are currently working on getting our October CTs scheduled.  Pt verbalized understanding.  Nothing further needed at this time.

## 2021-05-01 ENCOUNTER — Telehealth (HOSPITAL_BASED_OUTPATIENT_CLINIC_OR_DEPARTMENT_OTHER): Payer: Self-pay

## 2021-05-28 ENCOUNTER — Other Ambulatory Visit: Payer: Self-pay

## 2021-05-28 ENCOUNTER — Ambulatory Visit (HOSPITAL_BASED_OUTPATIENT_CLINIC_OR_DEPARTMENT_OTHER)
Admission: RE | Admit: 2021-05-28 | Discharge: 2021-05-28 | Disposition: A | Discharge status: Home / Self Care | Payer: Medicare Other | Source: Ambulatory Visit | Attending: Pulmonary Disease | Admitting: Pulmonary Disease

## 2021-05-28 DIAGNOSIS — R918 Other nonspecific abnormal finding of lung field: Secondary | ICD-10-CM | POA: Insufficient documentation

## 2021-05-28 DIAGNOSIS — R0609 Other forms of dyspnea: Secondary | ICD-10-CM | POA: Diagnosis present

## 2021-05-28 DIAGNOSIS — R0602 Shortness of breath: Secondary | ICD-10-CM | POA: Insufficient documentation

## 2021-05-31 NOTE — Progress Notes (Signed)
Please let patient know I have reviewed her CT scan results.  She has multiple small subcentimeter less than 4 mm pulmonary nodules unchanged from prior exam which are consistent with them being benign.  No additional imaging is needed for follow-up.  She can see [place] as needed.  Thanks,  BLI  Josephine Igo, DO [facility] 05/31/2021 12:41 PM

## 2021-05-31 NOTE — Progress Notes (Signed)
Spoke with pt and she voices understanding. Pt did not have any further questions.

## 2021-06-27 ENCOUNTER — Encounter: Payer: Self-pay | Admitting: Pulmonary Disease

## 2021-06-27 ENCOUNTER — Other Ambulatory Visit: Payer: Self-pay

## 2021-06-27 ENCOUNTER — Ambulatory Visit: Payer: Medicare Other | Admitting: Pulmonary Disease

## 2021-06-27 VITALS — BP 106/70 | HR 84 | Temp 98.2°F | Ht 65.0 in | Wt 166.0 lb

## 2021-06-27 DIAGNOSIS — Z23 Encounter for immunization: Secondary | ICD-10-CM

## 2021-06-27 DIAGNOSIS — R918 Other nonspecific abnormal finding of lung field: Secondary | ICD-10-CM

## 2021-06-27 NOTE — Patient Instructions (Signed)
Thank you for visiting Dr. Tonia Brooms at [facility]. Today we recommend the following:  Needs ful shot today   Return if symptoms worsen or fail to improve.    Please do your part to reduce the spread of COVID-19.
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
## 2021-09-19 ENCOUNTER — Other Ambulatory Visit: Payer: Self-pay

## 2021-09-19 ENCOUNTER — Ambulatory Visit: Payer: Medicare Other | Admitting: Podiatry

## 2021-09-19 ENCOUNTER — Encounter: Payer: Self-pay | Admitting: Podiatry

## 2021-09-19 DIAGNOSIS — L84 Corns and callosities: Secondary | ICD-10-CM | POA: Diagnosis not present

## 2021-09-20 NOTE — Progress Notes (Signed)
Subjective:   Patient ID: [name], female   DOB: 71 y.o.   MRN: [id number]   HPI Patient presents with painful lesions plantar aspect both feet that she cannot take care of herself   ROS      Objective:  Physical Exam  Neurovascular status intact with keratotic lesion plantar both feet with a lucent course     Assessment:  Porokeratotic lesion bilateral feet painful     Plan:  Sharp sterile debridement of lesions accomplished no iatrogenic bleeding reappoint for routine care

## 2021-12-07 DIAGNOSIS — I110 Hypertensive heart disease with heart failure: Secondary | ICD-10-CM | POA: Insufficient documentation

## 2021-12-07 DIAGNOSIS — I5032 Chronic diastolic (congestive) heart failure: Secondary | ICD-10-CM | POA: Insufficient documentation

## 2022-03-21 DIAGNOSIS — M2142 Flat foot [pes planus] (acquired), left foot: Secondary | ICD-10-CM | POA: Diagnosis not present

## 2022-03-21 DIAGNOSIS — M19072 Primary osteoarthritis, left ankle and foot: Secondary | ICD-10-CM | POA: Diagnosis not present

## 2022-03-21 DIAGNOSIS — M85672 Other cyst of bone, left ankle and foot: Secondary | ICD-10-CM | POA: Diagnosis not present

## 2022-03-21 DIAGNOSIS — M19079 Primary osteoarthritis, unspecified ankle and foot: Secondary | ICD-10-CM | POA: Diagnosis not present

## 2022-04-02 DIAGNOSIS — I110 Hypertensive heart disease with heart failure: Secondary | ICD-10-CM | POA: Diagnosis not present

## 2022-04-02 DIAGNOSIS — N1831 Chronic kidney disease, stage 3a: Secondary | ICD-10-CM | POA: Insufficient documentation

## 2022-04-02 DIAGNOSIS — E785 Hyperlipidemia, unspecified: Secondary | ICD-10-CM | POA: Diagnosis not present

## 2022-04-02 DIAGNOSIS — E039 Hypothyroidism, unspecified: Secondary | ICD-10-CM | POA: Diagnosis not present

## 2022-04-02 DIAGNOSIS — K2270 Barrett's esophagus without dysplasia: Secondary | ICD-10-CM | POA: Diagnosis not present

## 2022-04-03 DIAGNOSIS — I10 Essential (primary) hypertension: Secondary | ICD-10-CM | POA: Diagnosis not present

## 2022-04-03 DIAGNOSIS — E785 Hyperlipidemia, unspecified: Secondary | ICD-10-CM | POA: Diagnosis not present

## 2022-04-03 DIAGNOSIS — E039 Hypothyroidism, unspecified: Secondary | ICD-10-CM | POA: Diagnosis not present

## 2022-04-03 DIAGNOSIS — I110 Hypertensive heart disease with heart failure: Secondary | ICD-10-CM | POA: Diagnosis not present

## 2022-04-11 DIAGNOSIS — R8281 Pyuria: Secondary | ICD-10-CM | POA: Diagnosis not present

## 2022-04-11 DIAGNOSIS — R7989 Other specified abnormal findings of blood chemistry: Secondary | ICD-10-CM | POA: Diagnosis not present

## 2022-04-11 DIAGNOSIS — R801 Persistent proteinuria, unspecified: Secondary | ICD-10-CM | POA: Diagnosis not present

## 2022-04-15 DIAGNOSIS — Z1231 Encounter for screening mammogram for malignant neoplasm of breast: Secondary | ICD-10-CM | POA: Diagnosis not present

## 2022-04-16 DIAGNOSIS — M19079 Primary osteoarthritis, unspecified ankle and foot: Secondary | ICD-10-CM | POA: Diagnosis not present

## 2022-04-16 DIAGNOSIS — M216X2 Other acquired deformities of left foot: Secondary | ICD-10-CM | POA: Diagnosis not present

## 2022-04-16 DIAGNOSIS — M2142 Flat foot [pes planus] (acquired), left foot: Secondary | ICD-10-CM | POA: Diagnosis not present

## 2022-04-16 DIAGNOSIS — Z981 Arthrodesis status: Secondary | ICD-10-CM | POA: Diagnosis not present

## 2022-04-16 DIAGNOSIS — M25775 Osteophyte, left foot: Secondary | ICD-10-CM | POA: Diagnosis not present

## 2022-04-16 DIAGNOSIS — M79672 Pain in left foot: Secondary | ICD-10-CM | POA: Diagnosis not present

## 2022-04-17 DIAGNOSIS — M542 Cervicalgia: Secondary | ICD-10-CM | POA: Diagnosis not present

## 2022-04-17 DIAGNOSIS — M62838 Other muscle spasm: Secondary | ICD-10-CM | POA: Diagnosis not present

## 2022-04-17 DIAGNOSIS — M47812 Spondylosis without myelopathy or radiculopathy, cervical region: Secondary | ICD-10-CM | POA: Diagnosis not present

## 2022-04-24 DIAGNOSIS — Z23 Encounter for immunization: Secondary | ICD-10-CM | POA: Diagnosis not present

## 2022-04-24 DIAGNOSIS — R9431 Abnormal electrocardiogram [ECG] [EKG]: Secondary | ICD-10-CM | POA: Diagnosis not present

## 2022-04-24 DIAGNOSIS — I517 Cardiomegaly: Secondary | ICD-10-CM | POA: Diagnosis not present

## 2022-04-24 DIAGNOSIS — D72819 Decreased white blood cell count, unspecified: Secondary | ICD-10-CM | POA: Diagnosis not present

## 2022-04-24 DIAGNOSIS — N1831 Chronic kidney disease, stage 3a: Secondary | ICD-10-CM | POA: Diagnosis not present

## 2022-04-24 DIAGNOSIS — I129 Hypertensive chronic kidney disease with stage 1 through stage 4 chronic kidney disease, or unspecified chronic kidney disease: Secondary | ICD-10-CM | POA: Diagnosis not present

## 2022-04-24 DIAGNOSIS — Z0000 Encounter for general adult medical examination without abnormal findings: Secondary | ICD-10-CM | POA: Diagnosis not present

## 2022-04-24 DIAGNOSIS — I10 Essential (primary) hypertension: Secondary | ICD-10-CM | POA: Diagnosis not present

## 2022-04-24 DIAGNOSIS — D708 Other neutropenia: Secondary | ICD-10-CM | POA: Insufficient documentation

## 2022-04-25 DIAGNOSIS — L308 Other specified dermatitis: Secondary | ICD-10-CM | POA: Diagnosis not present

## 2022-04-25 DIAGNOSIS — Z85828 Personal history of other malignant neoplasm of skin: Secondary | ICD-10-CM | POA: Diagnosis not present

## 2022-04-25 DIAGNOSIS — L309 Dermatitis, unspecified: Secondary | ICD-10-CM | POA: Diagnosis not present

## 2022-04-25 DIAGNOSIS — L304 Erythema intertrigo: Secondary | ICD-10-CM | POA: Diagnosis not present

## 2022-05-09 DIAGNOSIS — K588 Other irritable bowel syndrome: Secondary | ICD-10-CM | POA: Diagnosis not present

## 2022-05-09 DIAGNOSIS — K648 Other hemorrhoids: Secondary | ICD-10-CM | POA: Diagnosis not present

## 2022-05-22 DIAGNOSIS — R6889 Other general symptoms and signs: Secondary | ICD-10-CM | POA: Diagnosis not present

## 2022-05-22 DIAGNOSIS — Z7409 Other reduced mobility: Secondary | ICD-10-CM | POA: Diagnosis not present

## 2022-05-22 DIAGNOSIS — M6289 Other specified disorders of muscle: Secondary | ICD-10-CM | POA: Diagnosis not present

## 2022-05-22 DIAGNOSIS — M542 Cervicalgia: Secondary | ICD-10-CM | POA: Diagnosis not present

## 2022-05-30 DIAGNOSIS — R6889 Other general symptoms and signs: Secondary | ICD-10-CM | POA: Diagnosis not present

## 2022-05-30 DIAGNOSIS — M6289 Other specified disorders of muscle: Secondary | ICD-10-CM | POA: Diagnosis not present

## 2022-05-30 DIAGNOSIS — Z7409 Other reduced mobility: Secondary | ICD-10-CM | POA: Diagnosis not present

## 2022-05-30 DIAGNOSIS — M542 Cervicalgia: Secondary | ICD-10-CM | POA: Diagnosis not present

## 2022-05-31 DIAGNOSIS — Z780 Asymptomatic menopausal state: Secondary | ICD-10-CM | POA: Diagnosis not present

## 2022-05-31 DIAGNOSIS — M85852 Other specified disorders of bone density and structure, left thigh: Secondary | ICD-10-CM | POA: Diagnosis not present

## 2022-06-04 DIAGNOSIS — Z9889 Other specified postprocedural states: Secondary | ICD-10-CM | POA: Diagnosis not present

## 2022-06-04 DIAGNOSIS — R079 Chest pain, unspecified: Secondary | ICD-10-CM | POA: Diagnosis not present

## 2022-06-18 DIAGNOSIS — M542 Cervicalgia: Secondary | ICD-10-CM | POA: Diagnosis not present

## 2022-06-18 DIAGNOSIS — M6289 Other specified disorders of muscle: Secondary | ICD-10-CM | POA: Diagnosis not present

## 2022-06-18 DIAGNOSIS — R6889 Other general symptoms and signs: Secondary | ICD-10-CM | POA: Diagnosis not present

## 2022-06-18 DIAGNOSIS — Z7409 Other reduced mobility: Secondary | ICD-10-CM | POA: Diagnosis not present

## 2022-06-20 DIAGNOSIS — J069 Acute upper respiratory infection, unspecified: Secondary | ICD-10-CM | POA: Diagnosis not present

## 2022-06-20 DIAGNOSIS — R051 Acute cough: Secondary | ICD-10-CM | POA: Diagnosis not present

## 2022-06-23 DIAGNOSIS — J40 Bronchitis, not specified as acute or chronic: Secondary | ICD-10-CM | POA: Diagnosis not present

## 2022-06-28 ENCOUNTER — Ambulatory Visit: Payer: Medicare Other | Admitting: Internal Medicine

## 2022-06-28 ENCOUNTER — Encounter: Payer: Self-pay | Admitting: Internal Medicine

## 2022-06-28 VITALS — BP 146/68 | HR 71 | Temp 98.0°F | Ht 65.0 in | Wt 164.2 lb

## 2022-06-28 DIAGNOSIS — J069 Acute upper respiratory infection, unspecified: Secondary | ICD-10-CM | POA: Diagnosis not present

## 2022-06-28 DIAGNOSIS — R058 Other specified cough: Secondary | ICD-10-CM | POA: Diagnosis not present

## 2022-06-28 NOTE — Patient Instructions (Signed)
You have post viral cough syndrome. This is a cough that lingers after a viral upper respiratory infection. It can last for up to 8 weeks.   I recommend continuing the promethazine cough syrup you were given.  Because of your high blood pressure I would not recommend pseudoephedrine.  You can try saline nasal sprays to help flush out your nose and sinuses.  You can continue mucinex over the counter - just follow the instructions on the bottle/box for maximum dosage.  Additional steroids or antibiotics will not be helpful.  This has to run its course.
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
## 2022-07-04 ENCOUNTER — Encounter: Payer: Self-pay | Admitting: Podiatry

## 2022-07-04 ENCOUNTER — Ambulatory Visit: Payer: Medicare Other | Admitting: Podiatry

## 2022-07-04 DIAGNOSIS — M7671 Peroneal tendinitis, right leg: Secondary | ICD-10-CM | POA: Diagnosis not present

## 2022-07-04 DIAGNOSIS — L84 Corns and callosities: Secondary | ICD-10-CM

## 2022-07-04 MED ORDER — TRIAMCINOLONE ACETONIDE 10 MG/ML IJ SUSP
10.0000 mg | Freq: Once | INTRAMUSCULAR | Status: AC
  Administered 2022-07-04: 10 mg

## 2022-07-04 NOTE — Progress Notes (Signed)
Subjective:   Patient ID: [name], female   DOB: 71 y.o.   MRN: [id number]   HPI Patient states having a lot of pain in the outside of the right foot has calluses that have formed underneath the right foot and on the big toes of both feet and also has nails that get thick and   ROS      Objective:  Physical Exam  Neurovascular status intact inflammation base of fifth metatarsal fluid buildup around the joint and pain with keratotic lesions hallux bilateral metatarsal third right with pain     Assessment:  Inflammatory tendinitis peroneal insertion right with lesion formation bilateral painful     Plan:  H&P reviewed debridement of lesions no angiogenic bleeding and sterile prep and injected the peroneal insertion right after explaining risk 3 mg Dexasone Kenalog 5 mg Xylocaine.  Reappoint as symptoms indicate

## 2022-07-05 DIAGNOSIS — R6889 Other general symptoms and signs: Secondary | ICD-10-CM | POA: Diagnosis not present

## 2022-07-05 DIAGNOSIS — Z7409 Other reduced mobility: Secondary | ICD-10-CM | POA: Diagnosis not present

## 2022-07-05 DIAGNOSIS — M542 Cervicalgia: Secondary | ICD-10-CM | POA: Diagnosis not present

## 2022-07-05 DIAGNOSIS — M6289 Other specified disorders of muscle: Secondary | ICD-10-CM | POA: Diagnosis not present

## 2022-07-09 DIAGNOSIS — N1831 Chronic kidney disease, stage 3a: Secondary | ICD-10-CM | POA: Diagnosis not present

## 2022-07-09 DIAGNOSIS — M8589 Other specified disorders of bone density and structure, multiple sites: Secondary | ICD-10-CM | POA: Diagnosis not present

## 2022-07-09 DIAGNOSIS — E782 Mixed hyperlipidemia: Secondary | ICD-10-CM | POA: Diagnosis not present

## 2022-07-09 DIAGNOSIS — I130 Hypertensive heart and chronic kidney disease with heart failure and stage 1 through stage 4 chronic kidney disease, or unspecified chronic kidney disease: Secondary | ICD-10-CM | POA: Diagnosis not present

## 2022-07-10 DIAGNOSIS — M8589 Other specified disorders of bone density and structure, multiple sites: Secondary | ICD-10-CM | POA: Diagnosis not present

## 2022-07-10 DIAGNOSIS — E782 Mixed hyperlipidemia: Secondary | ICD-10-CM | POA: Diagnosis not present

## 2022-07-10 DIAGNOSIS — I10 Essential (primary) hypertension: Secondary | ICD-10-CM | POA: Diagnosis not present

## 2022-07-10 DIAGNOSIS — N1831 Chronic kidney disease, stage 3a: Secondary | ICD-10-CM | POA: Diagnosis not present

## 2022-08-08 DIAGNOSIS — Z85828 Personal history of other malignant neoplasm of skin: Secondary | ICD-10-CM | POA: Diagnosis not present

## 2022-08-08 DIAGNOSIS — L723 Sebaceous cyst: Secondary | ICD-10-CM | POA: Diagnosis not present

## 2022-08-08 DIAGNOSIS — L309 Dermatitis, unspecified: Secondary | ICD-10-CM | POA: Diagnosis not present

## 2022-10-07 DIAGNOSIS — Z85828 Personal history of other malignant neoplasm of skin: Secondary | ICD-10-CM | POA: Diagnosis not present

## 2022-10-07 DIAGNOSIS — L821 Other seborrheic keratosis: Secondary | ICD-10-CM | POA: Diagnosis not present

## 2022-10-07 DIAGNOSIS — L720 Epidermal cyst: Secondary | ICD-10-CM | POA: Diagnosis not present

## 2022-10-07 DIAGNOSIS — D2261 Melanocytic nevi of right upper limb, including shoulder: Secondary | ICD-10-CM | POA: Diagnosis not present

## 2022-10-30 DIAGNOSIS — K219 Gastro-esophageal reflux disease without esophagitis: Secondary | ICD-10-CM | POA: Diagnosis not present

## 2022-10-30 DIAGNOSIS — Z6827 Body mass index (BMI) 27.0-27.9, adult: Secondary | ICD-10-CM | POA: Diagnosis not present

## 2022-10-30 DIAGNOSIS — K2270 Barrett's esophagus without dysplasia: Secondary | ICD-10-CM | POA: Diagnosis not present

## 2022-10-30 DIAGNOSIS — R1084 Generalized abdominal pain: Secondary | ICD-10-CM | POA: Diagnosis not present

## 2022-10-30 DIAGNOSIS — E663 Overweight: Secondary | ICD-10-CM | POA: Diagnosis not present

## 2022-11-21 DIAGNOSIS — Z1211 Encounter for screening for malignant neoplasm of colon: Secondary | ICD-10-CM | POA: Diagnosis not present

## 2022-11-21 DIAGNOSIS — K2270 Barrett's esophagus without dysplasia: Secondary | ICD-10-CM | POA: Diagnosis not present

## 2022-11-26 DIAGNOSIS — K2270 Barrett's esophagus without dysplasia: Secondary | ICD-10-CM | POA: Diagnosis not present

## 2022-12-12 DIAGNOSIS — Z0000 Encounter for general adult medical examination without abnormal findings: Secondary | ICD-10-CM | POA: Diagnosis not present

## 2022-12-12 DIAGNOSIS — K449 Diaphragmatic hernia without obstruction or gangrene: Secondary | ICD-10-CM | POA: Diagnosis not present

## 2022-12-12 DIAGNOSIS — K219 Gastro-esophageal reflux disease without esophagitis: Secondary | ICD-10-CM | POA: Diagnosis not present

## 2022-12-12 DIAGNOSIS — K2270 Barrett's esophagus without dysplasia: Secondary | ICD-10-CM | POA: Diagnosis not present

## 2023-01-08 ENCOUNTER — Ambulatory Visit: Payer: Medicare Other | Admitting: Podiatry

## 2023-01-08 ENCOUNTER — Encounter: Payer: Self-pay | Admitting: Podiatry

## 2023-01-08 DIAGNOSIS — L84 Corns and callosities: Secondary | ICD-10-CM

## 2023-01-08 DIAGNOSIS — L600 Ingrowing nail: Secondary | ICD-10-CM

## 2023-01-09 NOTE — Progress Notes (Signed)
Subjective:   Patient ID: [name], female   DOB: 72 y.o.   MRN: [id number]   HPI Patient presents with chronic lesion on the left big toe and the right fifth toe that are painful and hard for her to take care of of and also complains of nail disease third and fourth nails left over right   ROS      Objective:  Physical Exam  Neurovascular status was found to be intact with patient found to have keratotic lesion left big toe medial side of the toe right fifth toe both of them being painful and nail disease third and fourth bilateral left over right very thickened dystrophic.  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

## 2023-01-30 DIAGNOSIS — I5032 Chronic diastolic (congestive) heart failure: Secondary | ICD-10-CM | POA: Diagnosis not present

## 2023-02-10 DIAGNOSIS — K2270 Barrett's esophagus without dysplasia: Secondary | ICD-10-CM | POA: Diagnosis not present

## 2023-02-10 DIAGNOSIS — R109 Unspecified abdominal pain: Secondary | ICD-10-CM | POA: Diagnosis not present

## 2023-02-10 DIAGNOSIS — K659 Peritonitis, unspecified: Secondary | ICD-10-CM | POA: Diagnosis not present

## 2023-02-10 DIAGNOSIS — K5190 Ulcerative colitis, unspecified, without complications: Secondary | ICD-10-CM | POA: Diagnosis not present

## 2023-02-10 DIAGNOSIS — Z6827 Body mass index (BMI) 27.0-27.9, adult: Secondary | ICD-10-CM | POA: Diagnosis not present

## 2023-02-10 DIAGNOSIS — I10 Essential (primary) hypertension: Secondary | ICD-10-CM | POA: Diagnosis not present

## 2023-02-21 DIAGNOSIS — Z0000 Encounter for general adult medical examination without abnormal findings: Secondary | ICD-10-CM | POA: Diagnosis not present

## 2023-02-21 DIAGNOSIS — R9431 Abnormal electrocardiogram [ECG] [EKG]: Secondary | ICD-10-CM | POA: Diagnosis not present

## 2023-02-21 DIAGNOSIS — N1831 Chronic kidney disease, stage 3a: Secondary | ICD-10-CM | POA: Diagnosis not present

## 2023-02-21 DIAGNOSIS — E782 Mixed hyperlipidemia: Secondary | ICD-10-CM | POA: Diagnosis not present

## 2023-02-21 DIAGNOSIS — I10 Essential (primary) hypertension: Secondary | ICD-10-CM | POA: Diagnosis not present

## 2023-02-21 DIAGNOSIS — I129 Hypertensive chronic kidney disease with stage 1 through stage 4 chronic kidney disease, or unspecified chronic kidney disease: Secondary | ICD-10-CM | POA: Diagnosis not present

## 2023-02-21 DIAGNOSIS — E890 Postprocedural hypothyroidism: Secondary | ICD-10-CM | POA: Diagnosis not present

## 2023-02-26 DIAGNOSIS — R109 Unspecified abdominal pain: Secondary | ICD-10-CM | POA: Diagnosis not present

## 2023-03-04 DIAGNOSIS — H524 Presbyopia: Secondary | ICD-10-CM | POA: Diagnosis not present

## 2023-03-14 DIAGNOSIS — M542 Cervicalgia: Secondary | ICD-10-CM | POA: Diagnosis not present

## 2023-03-18 DIAGNOSIS — E782 Mixed hyperlipidemia: Secondary | ICD-10-CM | POA: Diagnosis not present

## 2023-03-18 DIAGNOSIS — M47812 Spondylosis without myelopathy or radiculopathy, cervical region: Secondary | ICD-10-CM | POA: Diagnosis not present

## 2023-03-18 DIAGNOSIS — B372 Candidiasis of skin and nail: Secondary | ICD-10-CM | POA: Diagnosis not present

## 2023-03-18 DIAGNOSIS — G8929 Other chronic pain: Secondary | ICD-10-CM | POA: Diagnosis not present

## 2023-03-18 DIAGNOSIS — M4312 Spondylolisthesis, cervical region: Secondary | ICD-10-CM | POA: Diagnosis not present

## 2023-03-18 DIAGNOSIS — M542 Cervicalgia: Secondary | ICD-10-CM | POA: Diagnosis not present

## 2023-03-18 DIAGNOSIS — Z79899 Other long term (current) drug therapy: Secondary | ICD-10-CM | POA: Diagnosis not present

## 2023-03-27 DIAGNOSIS — M542 Cervicalgia: Secondary | ICD-10-CM | POA: Diagnosis not present

## 2023-04-04 DIAGNOSIS — M542 Cervicalgia: Secondary | ICD-10-CM | POA: Diagnosis not present

## 2023-04-11 DIAGNOSIS — M542 Cervicalgia: Secondary | ICD-10-CM | POA: Diagnosis not present

## 2023-04-18 DIAGNOSIS — M542 Cervicalgia: Secondary | ICD-10-CM | POA: Diagnosis not present

## 2023-04-23 DIAGNOSIS — Z1231 Encounter for screening mammogram for malignant neoplasm of breast: Secondary | ICD-10-CM | POA: Diagnosis not present

## 2023-05-09 DIAGNOSIS — M542 Cervicalgia: Secondary | ICD-10-CM | POA: Diagnosis not present

## 2023-05-14 DIAGNOSIS — K648 Other hemorrhoids: Secondary | ICD-10-CM | POA: Diagnosis not present

## 2023-05-14 DIAGNOSIS — K589 Irritable bowel syndrome without diarrhea: Secondary | ICD-10-CM | POA: Diagnosis not present

## 2023-05-14 DIAGNOSIS — K2270 Barrett's esophagus without dysplasia: Secondary | ICD-10-CM | POA: Diagnosis not present

## 2023-05-14 DIAGNOSIS — R1084 Generalized abdominal pain: Secondary | ICD-10-CM | POA: Diagnosis not present

## 2023-05-16 DIAGNOSIS — M542 Cervicalgia: Secondary | ICD-10-CM | POA: Diagnosis not present

## 2023-05-21 ENCOUNTER — Encounter: Payer: Self-pay | Admitting: Podiatry

## 2023-05-21 ENCOUNTER — Ambulatory Visit: Payer: Medicare Other | Admitting: Podiatry

## 2023-05-21 DIAGNOSIS — M7671 Peroneal tendinitis, right leg: Secondary | ICD-10-CM

## 2023-05-21 DIAGNOSIS — L84 Corns and callosities: Secondary | ICD-10-CM

## 2023-05-21 DIAGNOSIS — M2042 Other hammer toe(s) (acquired), left foot: Secondary | ICD-10-CM

## 2023-05-22 NOTE — Progress Notes (Signed)
Subjective:   Patient ID: [name], female   DOB: 72 y.o.   MRN: [id number]   HPI Patient presents with mild discomfort with history of inflammation outside of her right foot and chronic lesion formation with structural deformity hallux left painful when pressed.  Good digital perfusion noted    ROS      Objective:  Physical Exam  Peroneal tendinitis mild in nature right with inflammation with lesion formation left with digital deformity consistent with hammertoe type structure.     Assessment:  Peroneal tendinitis right with fluid that is mild along with lesion formation with structural deformity left      Plan:  H&P reviewed all conditions and shoe gear modifications and at this time went ahead and debrided lesion left courtesy no iatrogenic bleeding and discussed possible long-term correction of underlying deformity

## 2023-06-04 DIAGNOSIS — M542 Cervicalgia: Secondary | ICD-10-CM | POA: Diagnosis not present

## 2023-06-06 DIAGNOSIS — Z1330 Encounter for screening examination for mental health and behavioral disorders, unspecified: Secondary | ICD-10-CM | POA: Diagnosis not present

## 2023-06-06 DIAGNOSIS — R079 Chest pain, unspecified: Secondary | ICD-10-CM | POA: Diagnosis not present

## 2023-06-06 DIAGNOSIS — Z9889 Other specified postprocedural states: Secondary | ICD-10-CM | POA: Diagnosis not present

## 2023-06-13 DIAGNOSIS — M542 Cervicalgia: Secondary | ICD-10-CM | POA: Diagnosis not present

## 2023-08-04 DIAGNOSIS — M542 Cervicalgia: Secondary | ICD-10-CM | POA: Diagnosis not present

## 2023-08-04 DIAGNOSIS — R519 Headache, unspecified: Secondary | ICD-10-CM | POA: Diagnosis not present

## 2023-08-04 DIAGNOSIS — M26609 Unspecified temporomandibular joint disorder, unspecified side: Secondary | ICD-10-CM | POA: Diagnosis not present

## 2023-08-28 ENCOUNTER — Ambulatory Visit: Payer: Self-pay | Admitting: Family Medicine

## 2023-08-28 NOTE — Telephone Encounter (Signed)
  Chief Complaint: neck pain Symptoms: neck pain when turning head, headaches Frequency: x 5 weeks Pertinent Negatives: Patient denies fever, shortness of breath, swelling Disposition: [] ED /[x] Urgent Care (no appt availability in office) / [] Appointment(In office/virtual)/ []  [facility]/ [] Home Care/ [x] Refused Recommended Disposition /[] [facility]/ []  Follow-up with PCP Additional Notes: Patient reports that she has been experiencing neck pain when turning her head x 5 weeks. Patient reports she has previously been seen for this, sees PT, and has an upcoming appt with a spine specialist. Patient reports she has been having headaches since working with PT, as well. Patient reports she was just calling to get established with Dr. Aletha specifically at this practice as she saw her elsewhere previously. New pt appt made at patients request in February to be established. Patient reports she would like to wait until her spinal appt to be seen for neck pain. Patient advised to go to UC with worsening symptoms. Patient verbalized understanding.     Copied from CRM [id number]. Topic: Clinical - Red Word Triage >> Aug 28, 2023  1:42 PM Laurier C wrote: Red Word that prompted transfer to Nurse Triage: Patient states she's experiencing neck pain she has had PT and now she has started experiencing headaches as well. Reason for Disposition  [1] MODERATE neck pain (e.g., interferes with normal activities) AND [2] present > 3 days  Answer Assessment - Initial Assessment Questions 1. ONSET: When did the pain begin?      Several weeks 2. LOCATION: Where does it hurt?      I have trouble turning my neck to the left and right 3. PATTERN Does the pain come and go, or has it been constant since it started?      Comes and goes 4. SEVERITY: How bad is the pain?  (Scale 1-10; or mild, moderate, severe)   - NO PAIN (0): no pain or only slight stiffness    - MILD (1-3): doesn't  interfere with normal activities    - MODERATE (4-7): interferes with normal activities or awakens from sleep    - SEVERE (8-10):  excruciating pain, unable to do any normal activities      6/10 when turning head 5. RADIATION: Does the pain go anywhere else, shoot into your arms?     headache 6. CORD SYMPTOMS: Any weakness or numbness of the arms or legs?     none 7. CAUSE: What do you think is causing the neck pain?     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 NECK OVERUSE: Any recent activities that involved turning or twisting the neck?     PT 9. OTHER SYMPTOMS: Do you have any other symptoms? (e.g., headache, fever, chest pain, difficulty breathing, neck swelling)     Headache after PT appt  Protocols used: Neck Pain or Stiffness-A-AH

## 2023-09-04 DIAGNOSIS — M47812 Spondylosis without myelopathy or radiculopathy, cervical region: Secondary | ICD-10-CM | POA: Diagnosis not present

## 2023-09-04 DIAGNOSIS — M542 Cervicalgia: Secondary | ICD-10-CM | POA: Diagnosis not present

## 2023-09-10 DIAGNOSIS — Z96662 Presence of left artificial ankle joint: Secondary | ICD-10-CM | POA: Diagnosis not present

## 2023-09-10 DIAGNOSIS — M2142 Flat foot [pes planus] (acquired), left foot: Secondary | ICD-10-CM | POA: Diagnosis not present

## 2023-09-10 DIAGNOSIS — Z96661 Presence of right artificial ankle joint: Secondary | ICD-10-CM | POA: Diagnosis not present

## 2023-09-10 DIAGNOSIS — M19072 Primary osteoarthritis, left ankle and foot: Secondary | ICD-10-CM | POA: Diagnosis not present

## 2023-09-10 DIAGNOSIS — M79671 Pain in right foot: Secondary | ICD-10-CM | POA: Diagnosis not present

## 2023-09-10 DIAGNOSIS — M79672 Pain in left foot: Secondary | ICD-10-CM | POA: Diagnosis not present

## 2023-09-10 DIAGNOSIS — M7671 Peroneal tendinitis, right leg: Secondary | ICD-10-CM | POA: Diagnosis not present

## 2023-09-14 DIAGNOSIS — M5023 Other cervical disc displacement, cervicothoracic region: Secondary | ICD-10-CM | POA: Diagnosis not present

## 2023-09-14 DIAGNOSIS — M4802 Spinal stenosis, cervical region: Secondary | ICD-10-CM | POA: Diagnosis not present

## 2023-09-14 DIAGNOSIS — M47812 Spondylosis without myelopathy or radiculopathy, cervical region: Secondary | ICD-10-CM | POA: Diagnosis not present

## 2023-09-14 DIAGNOSIS — M542 Cervicalgia: Secondary | ICD-10-CM | POA: Diagnosis not present

## 2023-09-14 DIAGNOSIS — M5021 Other cervical disc displacement,  high cervical region: Secondary | ICD-10-CM | POA: Diagnosis not present

## 2023-09-15 DIAGNOSIS — H1032 Unspecified acute conjunctivitis, left eye: Secondary | ICD-10-CM | POA: Diagnosis not present

## 2023-09-26 ENCOUNTER — Ambulatory Visit (INDEPENDENT_AMBULATORY_CARE_PROVIDER_SITE_OTHER): Payer: Medicare Other | Admitting: Family Medicine

## 2023-09-26 ENCOUNTER — Encounter: Payer: Self-pay | Admitting: Family Medicine

## 2023-09-26 VITALS — BP 128/84 | HR 76 | Temp 97.6°F | Ht 65.0 in | Wt 173.2 lb

## 2023-09-26 DIAGNOSIS — E782 Mixed hyperlipidemia: Secondary | ICD-10-CM | POA: Diagnosis not present

## 2023-09-26 DIAGNOSIS — I10 Essential (primary) hypertension: Secondary | ICD-10-CM | POA: Diagnosis not present

## 2023-09-26 DIAGNOSIS — N1831 Chronic kidney disease, stage 3a: Secondary | ICD-10-CM

## 2023-09-26 DIAGNOSIS — Z23 Encounter for immunization: Secondary | ICD-10-CM | POA: Diagnosis not present

## 2023-09-26 DIAGNOSIS — Z79899 Other long term (current) drug therapy: Secondary | ICD-10-CM | POA: Diagnosis not present

## 2023-09-26 DIAGNOSIS — M542 Cervicalgia: Secondary | ICD-10-CM

## 2023-09-26 MED ORDER — METHOCARBAMOL 750 MG PO TABS: 750.0000 mg | ORAL_TABLET | Freq: Three times a day (TID) | ORAL | 0 refills | Status: AC
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
## 2023-09-30 DIAGNOSIS — M542 Cervicalgia: Secondary | ICD-10-CM | POA: Diagnosis not present

## 2023-10-01 DIAGNOSIS — M7671 Peroneal tendinitis, right leg: Secondary | ICD-10-CM | POA: Diagnosis not present

## 2023-10-01 DIAGNOSIS — M216X1 Other acquired deformities of right foot: Secondary | ICD-10-CM | POA: Diagnosis not present

## 2023-10-01 DIAGNOSIS — Z96661 Presence of right artificial ankle joint: Secondary | ICD-10-CM | POA: Diagnosis not present

## 2023-10-08 DIAGNOSIS — L218 Other seborrheic dermatitis: Secondary | ICD-10-CM | POA: Diagnosis not present

## 2023-10-08 DIAGNOSIS — L309 Dermatitis, unspecified: Secondary | ICD-10-CM | POA: Diagnosis not present

## 2023-10-08 DIAGNOSIS — Z85828 Personal history of other malignant neoplasm of skin: Secondary | ICD-10-CM | POA: Diagnosis not present

## 2023-10-08 DIAGNOSIS — L905 Scar conditions and fibrosis of skin: Secondary | ICD-10-CM | POA: Diagnosis not present

## 2023-10-10 DIAGNOSIS — M779 Enthesopathy, unspecified: Secondary | ICD-10-CM | POA: Diagnosis not present

## 2023-10-10 DIAGNOSIS — M7671 Peroneal tendinitis, right leg: Secondary | ICD-10-CM | POA: Diagnosis not present

## 2023-10-10 DIAGNOSIS — M65971 Unspecified synovitis and tenosynovitis, right ankle and foot: Secondary | ICD-10-CM | POA: Diagnosis not present

## 2023-10-10 DIAGNOSIS — Z96661 Presence of right artificial ankle joint: Secondary | ICD-10-CM | POA: Diagnosis not present

## 2023-10-10 DIAGNOSIS — M25571 Pain in right ankle and joints of right foot: Secondary | ICD-10-CM | POA: Diagnosis not present

## 2023-10-10 DIAGNOSIS — M65871 Other synovitis and tenosynovitis, right ankle and foot: Secondary | ICD-10-CM | POA: Diagnosis not present

## 2023-10-10 DIAGNOSIS — M216X1 Other acquired deformities of right foot: Secondary | ICD-10-CM | POA: Diagnosis not present

## 2023-10-13 DIAGNOSIS — M47812 Spondylosis without myelopathy or radiculopathy, cervical region: Secondary | ICD-10-CM | POA: Diagnosis not present

## 2023-10-13 DIAGNOSIS — G8929 Other chronic pain: Secondary | ICD-10-CM | POA: Diagnosis not present

## 2023-10-13 DIAGNOSIS — M542 Cervicalgia: Secondary | ICD-10-CM | POA: Diagnosis not present

## 2023-10-14 DIAGNOSIS — M216X1 Other acquired deformities of right foot: Secondary | ICD-10-CM | POA: Diagnosis not present

## 2023-10-14 DIAGNOSIS — M7671 Peroneal tendinitis, right leg: Secondary | ICD-10-CM | POA: Diagnosis not present

## 2023-10-14 DIAGNOSIS — Z96661 Presence of right artificial ankle joint: Secondary | ICD-10-CM | POA: Diagnosis not present

## 2023-10-21 ENCOUNTER — Encounter: Payer: Self-pay | Admitting: Family Medicine

## 2023-10-21 ENCOUNTER — Ambulatory Visit (INDEPENDENT_AMBULATORY_CARE_PROVIDER_SITE_OTHER): Payer: Medicare Other | Admitting: Family Medicine

## 2023-10-21 VITALS — BP 120/72 | HR 73 | Temp 98.3°F | Ht 65.0 in | Wt 169.2 lb

## 2023-10-21 DIAGNOSIS — E785 Hyperlipidemia, unspecified: Secondary | ICD-10-CM | POA: Diagnosis not present

## 2023-10-21 DIAGNOSIS — N3941 Urge incontinence: Secondary | ICD-10-CM | POA: Insufficient documentation

## 2023-10-21 DIAGNOSIS — I10 Essential (primary) hypertension: Secondary | ICD-10-CM

## 2023-10-21 DIAGNOSIS — E039 Hypothyroidism, unspecified: Secondary | ICD-10-CM

## 2023-10-21 DIAGNOSIS — M542 Cervicalgia: Secondary | ICD-10-CM | POA: Diagnosis not present

## 2023-10-21 DIAGNOSIS — Z79899 Other long term (current) drug therapy: Secondary | ICD-10-CM

## 2023-10-21 MED ORDER — AMLODIPINE BESYLATE 10 MG PO TABS: 10.0000 mg | ORAL_TABLET | Freq: Every day | ORAL | 1 refills | Status: DC

## 2023-10-21 MED ORDER — OXYBUTYNIN CHLORIDE ER 5 MG PO TB24: 5.0000 mg | ORAL_TABLET | Freq: Every day | ORAL | 1 refills | Status: AC

## 2023-10-21 MED ORDER — LOSARTAN POTASSIUM 100 MG PO TABS: 100.0000 mg | ORAL_TABLET | Freq: Every day | ORAL | 1 refills | Status: DC
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
## 2023-10-23 DIAGNOSIS — E785 Hyperlipidemia, unspecified: Secondary | ICD-10-CM | POA: Diagnosis not present

## 2023-10-23 DIAGNOSIS — E039 Hypothyroidism, unspecified: Secondary | ICD-10-CM | POA: Diagnosis not present

## 2023-10-24 LAB — LIPID PANEL
Cholesterol: 181 mg/dL (ref <200)
HDL: 76 mg/dL (ref >=50)
LDL Cholesterol (Calc): 77 mg/dL
Non-HDL Cholesterol (Calc): 105 mg/dL (ref <130)
Total CHOL/HDL Ratio: 2.4 (calc) (ref <5.0)
Triglycerides: 186 mg/dL — ABNORMAL HIGH (ref <150)

## 2023-10-24 LAB — TSH: TSH: 2.82 m[IU]/L (ref 0.40–4.50)

## 2023-10-27 ENCOUNTER — Encounter: Payer: Self-pay | Admitting: Family Medicine

## 2023-11-10 DIAGNOSIS — R159 Full incontinence of feces: Secondary | ICD-10-CM | POA: Diagnosis not present

## 2023-11-10 DIAGNOSIS — K2270 Barrett's esophagus without dysplasia: Secondary | ICD-10-CM | POA: Diagnosis not present

## 2023-11-10 DIAGNOSIS — S99922A Unspecified injury of left foot, initial encounter: Secondary | ICD-10-CM | POA: Diagnosis not present

## 2023-11-10 DIAGNOSIS — Z0000 Encounter for general adult medical examination without abnormal findings: Secondary | ICD-10-CM | POA: Diagnosis not present

## 2023-11-10 DIAGNOSIS — M79675 Pain in left toe(s): Secondary | ICD-10-CM | POA: Diagnosis not present

## 2023-11-10 DIAGNOSIS — M19072 Primary osteoarthritis, left ankle and foot: Secondary | ICD-10-CM | POA: Diagnosis not present

## 2023-11-10 DIAGNOSIS — Z79899 Other long term (current) drug therapy: Secondary | ICD-10-CM | POA: Diagnosis not present

## 2023-11-10 DIAGNOSIS — K589 Irritable bowel syndrome without diarrhea: Secondary | ICD-10-CM | POA: Diagnosis not present

## 2023-11-10 DIAGNOSIS — I10 Essential (primary) hypertension: Secondary | ICD-10-CM | POA: Diagnosis not present

## 2023-11-26 ENCOUNTER — Ambulatory Visit: Admitting: Podiatrist

## 2023-11-26 ENCOUNTER — Encounter: Payer: Self-pay | Admitting: Podiatrist

## 2023-11-26 ENCOUNTER — Ambulatory Visit (INDEPENDENT_AMBULATORY_CARE_PROVIDER_SITE_OTHER)

## 2023-11-26 VITALS — Ht 65.0 in | Wt 169.0 lb

## 2023-11-26 DIAGNOSIS — S90122D Contusion of left lesser toe(s) without damage to nail, subsequent encounter: Secondary | ICD-10-CM

## 2023-11-26 DIAGNOSIS — S92525D Nondisplaced fracture of medial phalanx of left lesser toe(s), subsequent encounter for fracture with routine healing: Secondary | ICD-10-CM

## 2023-11-26 NOTE — Progress Notes (Signed)
 Chief Complaint  Patient presents with   Foot Pain    My 3rd toe on my left foot is swollen, painful and I had a fall on 11/10/23 and was told I did not have any fracture on my xrays      HPI: Patient is 73 y.o. female who presents today for a swollen left third toe. She fell on 11/10/23 and was seen at urgent care-  where xrays were taken.  No fracture was noted at that visit but she continues to have swelling.  She also has had multiple surgeries to both feet. And a right ankle replacement.  She is interested in orthotics as recommended by her physician at [facility] who performed her ankle replacement.    No Active Allergies  Review of systems is negative except as noted in the HPI.  Denies nausea/ vomiting/ fevers/ chills or night sweats.   Denies difficulty breathing, denies calf pain or tenderness  Physical Exam  Patient is awake, alert, and oriented x 3.  In no acute distress.    Vascular status is intact with palpable pedal pulses DP and PT bilateral and capillary refill time less than 3 seconds bilateral.  No edema or erythema noted.   Neurological exam reveals epicritic and protective sensation grossly intact bilateral.   Dermatological exam reveals skin is supple and dry to bilateral feet.  Well healed surgical scars noted to both feet.  Some swelling is noted to the third digit of the left foot. Mild redness also present-  appears to be due to swelling.  Nail of the third toe does not appear infected.  .    Musculoskeletal exam: left third toe is swollen and painful with pressure distally.  Arthritic changes in the toes and midfoot noted bilateral.  Ankle replacement right is doing well.    Xray- 3 toe views taken-  a fracture is present distal head of the middle phalanx left third toe-  seen on ap and medial oblique views.  Headless screws are present toes 2,3,4 and fracture is distal to the distal portion of the screw.     Assessment:   ICD-10-CM   1. Closed nondisplaced fracture  of middle phalanx of lesser toe of left foot with routine healing, subsequent encounter  S92.525D     2. Contusion of lesser toe of left foot without damage to nail, subsequent encounter  S90.122D DG Foot Complete Left        Plan: Discussed exam and x-ray findings.  Discussed there is a small fracture very difficult to see on the x-ray and showed her the views and where the fracture line is located.  Recommended she apply Coban wrap to the distal tip of the toe to help with swelling and continue to wear the surgical shoe for the next several weeks.  She may discontinue use of the surgical shoe when she feels comfortable.  Also discussed orthotics would be beneficial for her and made an appointment for her to see Bethann Berkshire at her convenience.  She will call with any problems or concerns arise and otherwise will be seen back as needed for follow-up.

## 2023-12-08 DIAGNOSIS — K648 Other hemorrhoids: Secondary | ICD-10-CM | POA: Diagnosis not present

## 2023-12-23 ENCOUNTER — Other Ambulatory Visit

## 2024-01-13 DIAGNOSIS — M25871 Other specified joint disorders, right ankle and foot: Secondary | ICD-10-CM | POA: Diagnosis not present

## 2024-01-13 DIAGNOSIS — M79671 Pain in right foot: Secondary | ICD-10-CM | POA: Diagnosis not present

## 2024-01-13 DIAGNOSIS — M7671 Peroneal tendinitis, right leg: Secondary | ICD-10-CM | POA: Diagnosis not present

## 2024-01-13 DIAGNOSIS — Z96661 Presence of right artificial ankle joint: Secondary | ICD-10-CM | POA: Diagnosis not present

## 2024-01-13 DIAGNOSIS — Z471 Aftercare following joint replacement surgery: Secondary | ICD-10-CM | POA: Diagnosis not present

## 2024-01-13 DIAGNOSIS — M216X1 Other acquired deformities of right foot: Secondary | ICD-10-CM | POA: Diagnosis not present

## 2024-01-21 ENCOUNTER — Ambulatory Visit (INDEPENDENT_AMBULATORY_CARE_PROVIDER_SITE_OTHER): Admitting: Family Medicine

## 2024-01-21 ENCOUNTER — Telehealth: Payer: Self-pay

## 2024-01-21 ENCOUNTER — Encounter: Payer: Self-pay | Admitting: Family Medicine

## 2024-01-21 VITALS — BP 120/80 | HR 68 | Temp 98.6°F | Ht 65.0 in | Wt 171.0 lb

## 2024-01-21 DIAGNOSIS — M47812 Spondylosis without myelopathy or radiculopathy, cervical region: Secondary | ICD-10-CM | POA: Diagnosis not present

## 2024-01-21 DIAGNOSIS — N1831 Chronic kidney disease, stage 3a: Secondary | ICD-10-CM

## 2024-01-21 DIAGNOSIS — E039 Hypothyroidism, unspecified: Secondary | ICD-10-CM

## 2024-01-21 DIAGNOSIS — Z23 Encounter for immunization: Secondary | ICD-10-CM | POA: Diagnosis not present

## 2024-01-21 DIAGNOSIS — I10 Essential (primary) hypertension: Secondary | ICD-10-CM | POA: Diagnosis not present

## 2024-01-21 DIAGNOSIS — H109 Unspecified conjunctivitis: Secondary | ICD-10-CM

## 2024-01-21 DIAGNOSIS — E785 Hyperlipidemia, unspecified: Secondary | ICD-10-CM

## 2024-01-21 MED ORDER — LEVOTHYROXINE SODIUM 100 MCG PO TABS: 100.0000 ug | ORAL_TABLET | Freq: Every day | ORAL | 1 refills | Status: DC

## 2024-01-21 MED ORDER — SIMVASTATIN 40 MG PO TABS: 40.0000 mg | ORAL_TABLET | Freq: Every evening | ORAL | 1 refills | Status: DC

## 2024-01-21 MED ORDER — MOXIFLOXACIN HCL 0.5 % OP SOLN: 1.0000 [drp] | OPHTHALMIC | 0 refills | Status: AC | PRN
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
## 2024-01-21 NOTE — Telephone Encounter (Signed)
 Copied from CRM [id number]. Topic: Clinical - Medication Question >> Jan 21, 2024  2:25 PM Oddis Bench wrote: Reason for CRM: [facility] is calling about amoxyfoxicin eyeydrops 0.5 was not able to find the eye med on past or present list  [phone] [facility]

## 2024-01-21 NOTE — Assessment & Plan Note (Signed)
 Cervical MRI done January 2025

## 2024-01-22 ENCOUNTER — Ambulatory Visit: Payer: Self-pay | Admitting: Family Medicine

## 2024-01-22 LAB — COMPREHENSIVE METABOLIC PANEL WITH GFR
AG Ratio: 2.1 (calc) (ref 1.0–2.5)
ALT: 14 U/L (ref 6–29)
AST: 20 U/L (ref 10–35)
Albumin: 4.7 g/dL (ref 3.6–5.1)
Alkaline phosphatase (APISO): 83 U/L (ref 37–153)
BUN/Creatinine Ratio: 17 (calc) (ref 6–22)
BUN: 20 mg/dL (ref 7–25)
CO2: 28 mmol/L (ref 20–32)
Calcium: 9.7 mg/dL (ref 8.6–10.4)
Chloride: 101 mmol/L (ref 98–110)
Creat: 1.15 mg/dL — ABNORMAL HIGH (ref 0.60–1.00)
Globulin: 2.2 g/dL (ref 1.9–3.7)
Glucose, Bld: 104 mg/dL — ABNORMAL HIGH (ref 65–99)
Potassium: 5 mmol/L (ref 3.5–5.3)
Sodium: 139 mmol/L (ref 135–146)
Total Bilirubin: 0.5 mg/dL (ref 0.2–1.2)
Total Protein: 6.9 g/dL (ref 6.1–8.1)
eGFR: 50 mL/min/{1.73_m2} — ABNORMAL LOW (ref >=60)

## 2024-01-22 LAB — CBC WITH DIFFERENTIAL/PLATELET
Absolute Lymphocytes: 1165 {cells}/uL (ref 850–3900)
Absolute Monocytes: 538 {cells}/uL (ref 200–950)
Basophils Absolute: 69 {cells}/uL (ref 0–200)
Basophils Relative: 1.6 %
Eosinophils Absolute: 82 {cells}/uL (ref 15–500)
Eosinophils Relative: 1.9 %
HCT: 38.3 % (ref 35.0–45.0)
Hemoglobin: 12.5 g/dL (ref 11.7–15.5)
MCH: 32.4 pg (ref 27.0–33.0)
MCHC: 32.6 g/dL (ref 32.0–36.0)
MCV: 99.2 fL (ref 80.0–100.0)
MPV: 10.5 fL (ref 7.5–12.5)
Monocytes Relative: 12.5 %
Neutro Abs: 2447 {cells}/uL (ref 1500–7800)
Neutrophils Relative %: 56.9 %
Platelets: 267 10*3/uL (ref 140–400)
RBC: 3.86 10*6/uL (ref 3.80–5.10)
RDW: 13.3 % (ref 11.0–15.0)
Total Lymphocyte: 27.1 %
WBC: 4.3 10*3/uL (ref 3.8–10.8)

## 2024-01-26 ENCOUNTER — Ambulatory Visit: Payer: Self-pay

## 2024-01-26 NOTE — Telephone Encounter (Signed)
 FYI Only or Action Required?: Action required by provider  Patient was last seen in primary care on 01/21/2024 by Amadeo June, MD. Called Nurse Triage reporting Medication Problem. Symptoms began last week. Interventions attempted: Prescription medications: Vigamox . Symptoms are: stable.  Triage Disposition: Call PCP Now  Patient/caregiver understands and will follow disposition?: Yes- Call rec'd from [facility] needing order clarification. Colleen to f/u with Merlyn Starring this afternoon       Copied from CRM [id number]. Topic: Clinical - Medication Question >> Jan 26, 2024  3:40 PM Antwanette L wrote: Reason for CRM: Merlyn Starring from [facility] is calling to get clarification on moxifloxacin  (VIGAMOX ) 0.5 % ophthalmic solution. Merlyn Starring needs to know the frequency for the medication. Reason for Disposition  [1] Pharmacy calling with prescription question AND [2] triager unable to answer question  Answer Assessment - Initial Assessment Questions 1. NAME of MEDICINE: "What medicine(s) are you calling about?"     Moxifloxacin   2. QUESTION: "What is your question?" (e.g., double dose of medicine, side effect)     Need order clarification  3. PRESCRIBER: "Who prescribed the medicine?" Reason: if prescribed by specialist, call should be referred to that group.     Dr. Ferna How  4. SYMPTOMS: "Do you have any symptoms?" If Yes, ask: "What symptoms are you having?"  "How bad are the symptoms (e.g., mild, moderate, severe)     N/a  5. PREGNANCY:  "Is there any chance that you are pregnant?" "When was your last menstrual period?"     no  Protocols used: Medication Question Call-A-AH

## 2024-01-27 DIAGNOSIS — Z96661 Presence of right artificial ankle joint: Secondary | ICD-10-CM | POA: Diagnosis not present

## 2024-01-27 DIAGNOSIS — M216X1 Other acquired deformities of right foot: Secondary | ICD-10-CM | POA: Diagnosis not present

## 2024-01-27 DIAGNOSIS — M7671 Peroneal tendinitis, right leg: Secondary | ICD-10-CM | POA: Diagnosis not present

## 2024-02-25 ENCOUNTER — Ambulatory Visit

## 2024-02-25 VITALS — Ht 65.0 in | Wt 171.0 lb

## 2024-02-25 DIAGNOSIS — Z0000 Encounter for general adult medical examination without abnormal findings: Secondary | ICD-10-CM
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
## 2024-02-25 NOTE — Patient Instructions (Signed)
 [name] , Thank you for taking time out of your busy schedule to complete your Annual Wellness Visit with me. I enjoyed our conversation and look forward to speaking with you again next year. I, as well as your care team,  appreciate your ongoing commitment to your health goals. Please review the following plan we discussed and let me know if I can assist you in the future. Your Game plan/ To Do List     Follow up Visits: Next Medicare AWV with our clinical staff: In 1 year    Have you seen your provider in the last 6 months (3 months if uncontrolled diabetes)? Yes Next Office Visit with your provider: 04/26/24 @ 9:00  Clinician Recommendations:  Aim for 30 minutes of exercise or brisk walking, 6-8 glasses of water, and 5 servings of fruits and vegetables each day.       This is a list of the screening recommended for you and due dates:  Health Maintenance  Topic Date Due   Hepatitis C Screening  Never done   COVID-19 Vaccine (3 - Pfizer risk series) 10/29/2019   Zoster (Shingles) Vaccine (2 of 2) 03/17/2024   Flu Shot  03/19/2024   DTaP/Tdap/Td vaccine (3 - Td or Tdap) 10/16/2024   Medicare Annual Wellness Visit  02/24/2025   Colon Cancer Screening  06/15/2028   Pneumococcal Vaccine for age over 50  Completed   DEXA scan (bone density measurement)  Completed   Hepatitis B Vaccine  Aged Out   HPV Vaccine  Aged Out   Meningitis B Vaccine  Aged Out    Advanced directives: (ACP Link)Information on Advanced Care Planning can be found at [place]  Best boy Advance Health Care Directives Advance Health Care Directives. [email or web address]   Advance Care Planning is important because it:  [x]  Makes sure you receive the medical care that is consistent with your values, goals, and preferences  [x]  It provides guidance to your family and loved ones and reduces their decisional burden about whether or not they are making the right decisions based on your wishes.  Follow the link  provided in your after visit summary or read over the paperwork we have mailed to you to help you started getting your Advance Directives in place. If you need assistance in completing these, please reach out to us  so that we can help you!  See attachments for Preventive Care and Fall Prevention Tips.

## 2024-03-16 DIAGNOSIS — Z85828 Personal history of other malignant neoplasm of skin: Secondary | ICD-10-CM | POA: Diagnosis not present

## 2024-03-16 DIAGNOSIS — H524 Presbyopia: Secondary | ICD-10-CM | POA: Diagnosis not present

## 2024-03-16 DIAGNOSIS — L245 Irritant contact dermatitis due to other chemical products: Secondary | ICD-10-CM | POA: Diagnosis not present

## 2024-03-16 DIAGNOSIS — L720 Epidermal cyst: Secondary | ICD-10-CM | POA: Diagnosis not present

## 2024-03-31 DIAGNOSIS — K1329 Other disturbances of oral epithelium, including tongue: Secondary | ICD-10-CM | POA: Diagnosis not present

## 2024-04-09 ENCOUNTER — Other Ambulatory Visit: Payer: Self-pay | Admitting: Family Medicine

## 2024-04-09 DIAGNOSIS — I10 Essential (primary) hypertension: Secondary | ICD-10-CM

## 2024-04-09 NOTE — Telephone Encounter (Signed)
 Requested Prescriptions  Pending Prescriptions Disp Refills   amLODipine  (NORVASC ) 10 MG tablet [Pharmacy Med Name: AMLODIPINE  BESYLATE 10MG TABLETS] 90 tablet 1    Sig: TAKE 1 TABLET(10 MG) BY MOUTH DAILY     Cardiovascular: Calcium Channel Blockers 2 Passed - 04/09/2024  4:55 PM      Passed - Last BP in normal range    BP Readings from Last 1 Encounters:  01/21/24 120/80         Passed - Last Heart Rate in normal range    Pulse Readings from Last 1 Encounters:  01/21/24 68         Passed - Valid encounter within last 6 months    Recent Outpatient Visits           2 months ago Hypertension, unspecified type   [place] [facility] Aletha Bene, MD   5 months ago Benign essential hypertension   [facility] Aletha Bene, MD   6 months ago Benign essential hypertension   [facility] Aletha Bene, MD               losartan  (COZAAR ) 100 MG tablet [Pharmacy Med Name: LOSARTAN  100MG  TABLETS] 90 tablet 1    Sig: TAKE 1 TABLET(100 MG) BY MOUTH DAILY     Cardiovascular:  Angiotensin Receptor Blockers Failed - 04/09/2024  4:55 PM      Failed - Cr in normal range and within 180 days    Creat  Date Value Ref Range Status  01/21/2024 1.15 (H) 0.60 - 1.00 mg/dL Final         Passed - K in normal range and within 180 days    Potassium  Date Value Ref Range Status  01/21/2024 5.0 3.5 - 5.3 mmol/L Final         Passed - Patient is not pregnant      Passed - Last BP in normal range    BP Readings from Last 1 Encounters:  01/21/24 120/80         Passed - Valid encounter within last 6 months    Recent Outpatient Visits           2 months ago Hypertension, unspecified type   [facility] Aletha Bene, MD   5 months ago Benign essential hypertension   [place] [facility] Aletha Bene, MD   6 months ago Benign essential hypertension    [facility] Aletha Bene, MD

## 2024-04-26 ENCOUNTER — Encounter: Payer: Self-pay | Admitting: Family Medicine

## 2024-04-26 ENCOUNTER — Ambulatory Visit (INDEPENDENT_AMBULATORY_CARE_PROVIDER_SITE_OTHER): Admitting: Family Medicine

## 2024-04-26 VITALS — BP 126/84 | HR 64 | Temp 98.4°F | Ht 65.0 in | Wt 166.2 lb

## 2024-04-26 DIAGNOSIS — I10 Essential (primary) hypertension: Secondary | ICD-10-CM | POA: Diagnosis not present

## 2024-04-26 DIAGNOSIS — Z23 Encounter for immunization: Secondary | ICD-10-CM

## 2024-04-26 DIAGNOSIS — E785 Hyperlipidemia, unspecified: Secondary | ICD-10-CM

## 2024-04-26 DIAGNOSIS — R7309 Other abnormal glucose: Secondary | ICD-10-CM

## 2024-04-26 DIAGNOSIS — Z79899 Other long term (current) drug therapy: Secondary | ICD-10-CM

## 2024-04-26 DIAGNOSIS — E039 Hypothyroidism, unspecified: Secondary | ICD-10-CM | POA: Diagnosis not present
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
## 2024-04-27 ENCOUNTER — Ambulatory Visit: Payer: Self-pay | Admitting: Family Medicine

## 2024-04-28 LAB — HEMOGLOBIN A1C
Hgb A1c MFr Bld: 6 % — ABNORMAL HIGH (ref <5.7)
Mean Plasma Glucose: 126 mg/dL
eAG (mmol/L): 7 mmol/L

## 2024-04-28 LAB — COMPLETE METABOLIC PANEL WITHOUT GFR
AG Ratio: 2 (calc) (ref 1.0–2.5)
ALT: 15 U/L (ref 6–29)
AST: 21 U/L (ref 10–35)
Albumin: 4.7 g/dL (ref 3.6–5.1)
Alkaline phosphatase (APISO): 85 U/L (ref 37–153)
BUN: 20 mg/dL (ref 7–25)
CO2: 23 mmol/L (ref 20–32)
Calcium: 9.5 mg/dL (ref 8.6–10.4)
Chloride: 104 mmol/L (ref 98–110)
Creat: 0.99 mg/dL (ref 0.60–1.00)
Globulin: 2.4 g/dL (ref 1.9–3.7)
Glucose, Bld: 97 mg/dL (ref 65–99)
Potassium: 4.9 mmol/L (ref 3.5–5.3)
Sodium: 143 mmol/L (ref 135–146)
Total Bilirubin: 0.5 mg/dL (ref 0.2–1.2)
Total Protein: 7.1 g/dL (ref 6.1–8.1)

## 2024-04-28 LAB — LIPID PANEL
Cholesterol: 185 mg/dL (ref <200)
HDL: 80 mg/dL (ref >=50)
LDL Cholesterol (Calc): 83 mg/dL
Non-HDL Cholesterol (Calc): 105 mg/dL (ref <130)
Total CHOL/HDL Ratio: 2.3 (calc) (ref <5.0)
Triglycerides: 128 mg/dL (ref <150)

## 2024-06-09 DIAGNOSIS — R1084 Generalized abdominal pain: Secondary | ICD-10-CM | POA: Diagnosis not present

## 2024-06-09 DIAGNOSIS — K589 Irritable bowel syndrome without diarrhea: Secondary | ICD-10-CM | POA: Diagnosis not present

## 2024-06-09 DIAGNOSIS — K2270 Barrett's esophagus without dysplasia: Secondary | ICD-10-CM | POA: Diagnosis not present

## 2024-06-09 DIAGNOSIS — I10 Essential (primary) hypertension: Secondary | ICD-10-CM | POA: Diagnosis not present

## 2024-06-09 DIAGNOSIS — N390 Urinary tract infection, site not specified: Secondary | ICD-10-CM | POA: Diagnosis not present

## 2024-06-22 DIAGNOSIS — R1084 Generalized abdominal pain: Secondary | ICD-10-CM | POA: Diagnosis not present

## 2024-07-07 DIAGNOSIS — Z1231 Encounter for screening mammogram for malignant neoplasm of breast: Secondary | ICD-10-CM | POA: Diagnosis not present

## 2024-07-07 LAB — HM MAMMOGRAPHY

## 2024-07-13 DIAGNOSIS — L308 Other specified dermatitis: Secondary | ICD-10-CM | POA: Diagnosis not present

## 2024-07-19 ENCOUNTER — Other Ambulatory Visit: Payer: Self-pay | Admitting: Family Medicine

## 2024-07-19 DIAGNOSIS — E039 Hypothyroidism, unspecified: Secondary | ICD-10-CM

## 2024-07-19 DIAGNOSIS — E785 Hyperlipidemia, unspecified: Secondary | ICD-10-CM

## 2024-07-20 DIAGNOSIS — R1084 Generalized abdominal pain: Secondary | ICD-10-CM | POA: Diagnosis not present

## 2024-07-20 DIAGNOSIS — Z6827 Body mass index (BMI) 27.0-27.9, adult: Secondary | ICD-10-CM | POA: Diagnosis not present

## 2024-07-20 DIAGNOSIS — K8020 Calculus of gallbladder without cholecystitis without obstruction: Secondary | ICD-10-CM | POA: Diagnosis not present

## 2024-08-16 ENCOUNTER — Ambulatory Visit: Admitting: Family Medicine

## 2024-08-24 ENCOUNTER — Ambulatory Visit (INDEPENDENT_AMBULATORY_CARE_PROVIDER_SITE_OTHER): Admitting: Family Medicine

## 2024-08-24 ENCOUNTER — Encounter: Payer: Self-pay | Admitting: Family Medicine

## 2024-08-24 VITALS — BP 124/72 | HR 65 | Ht 65.0 in | Wt 173.1 lb

## 2024-08-24 DIAGNOSIS — R7303 Prediabetes: Secondary | ICD-10-CM

## 2024-08-24 DIAGNOSIS — E039 Hypothyroidism, unspecified: Secondary | ICD-10-CM | POA: Diagnosis not present

## 2024-08-24 DIAGNOSIS — Z79899 Other long term (current) drug therapy: Secondary | ICD-10-CM

## 2024-08-24 DIAGNOSIS — K2270 Barrett's esophagus without dysplasia: Secondary | ICD-10-CM

## 2024-08-24 DIAGNOSIS — L209 Atopic dermatitis, unspecified: Secondary | ICD-10-CM | POA: Diagnosis not present

## 2024-08-24 DIAGNOSIS — E785 Hyperlipidemia, unspecified: Secondary | ICD-10-CM | POA: Diagnosis not present

## 2024-08-24 DIAGNOSIS — N1831 Chronic kidney disease, stage 3a: Secondary | ICD-10-CM | POA: Diagnosis not present

## 2024-08-24 DIAGNOSIS — K219 Gastro-esophageal reflux disease without esophagitis: Secondary | ICD-10-CM

## 2024-08-24 DIAGNOSIS — I10 Essential (primary) hypertension: Secondary | ICD-10-CM | POA: Diagnosis not present
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
## 2024-08-25 LAB — CBC WITH DIFFERENTIAL/PLATELET
Absolute Lymphocytes: 972 {cells}/uL (ref 850–3900)
Absolute Monocytes: 619 {cells}/uL (ref 200–950)
Basophils Absolute: 60 {cells}/uL (ref 0–200)
Basophils Relative: 1.4 %
Eosinophils Absolute: 120 {cells}/uL (ref 15–500)
Eosinophils Relative: 2.8 %
HCT: 36.6 % (ref 35.9–46.0)
Hemoglobin: 11.8 g/dL (ref 11.7–15.5)
MCH: 31.7 pg (ref 27.0–33.0)
MCHC: 32.2 g/dL (ref 31.6–35.4)
MCV: 98.4 fL (ref 81.4–101.7)
MPV: 10.7 fL (ref 7.5–12.5)
Monocytes Relative: 14.4 %
Neutro Abs: 2528 {cells}/uL (ref 1500–7800)
Neutrophils Relative %: 58.8 %
Platelets: 239 Thousand/uL (ref 140–400)
RBC: 3.72 Million/uL — ABNORMAL LOW (ref 3.80–5.10)
RDW: 12.8 % (ref 11.0–15.0)
Total Lymphocyte: 22.6 %
WBC: 4.3 Thousand/uL (ref 3.8–10.8)

## 2024-08-25 LAB — COMPREHENSIVE METABOLIC PANEL WITH GFR
AG Ratio: 2.2 (calc) (ref 1.0–2.5)
ALT: 15 U/L (ref 6–29)
AST: 19 U/L (ref 10–35)
Albumin: 4.4 g/dL (ref 3.6–5.1)
Alkaline phosphatase (APISO): 76 U/L (ref 37–153)
BUN/Creatinine Ratio: 18 (calc) (ref 6–22)
BUN: 22 mg/dL (ref 7–25)
CO2: 27 mmol/L (ref 20–32)
Calcium: 9.2 mg/dL (ref 8.6–10.4)
Chloride: 101 mmol/L (ref 98–110)
Creat: 1.2 mg/dL — ABNORMAL HIGH (ref 0.60–1.00)
Globulin: 2 g/dL (ref 1.9–3.7)
Glucose, Bld: 102 mg/dL — ABNORMAL HIGH (ref 65–99)
Potassium: 4.2 mmol/L (ref 3.5–5.3)
Sodium: 138 mmol/L (ref 135–146)
Total Bilirubin: 0.4 mg/dL (ref 0.2–1.2)
Total Protein: 6.4 g/dL (ref 6.1–8.1)
eGFR: 47 mL/min/1.73m2 — ABNORMAL LOW

## 2024-08-25 LAB — HEMOGLOBIN A1C
Hgb A1c MFr Bld: 5.9 % — ABNORMAL HIGH
Mean Plasma Glucose: 123 mg/dL
eAG (mmol/L): 6.8 mmol/L

## 2024-08-25 LAB — LIPID PANEL
Cholesterol: 195 mg/dL
HDL: 78 mg/dL
LDL Cholesterol (Calc): 99 mg/dL
Non-HDL Cholesterol (Calc): 117 mg/dL
Total CHOL/HDL Ratio: 2.5 (calc)
Triglycerides: 90 mg/dL

## 2024-08-25 LAB — TSH: TSH: 5.18 m[IU]/L — ABNORMAL HIGH (ref 0.40–4.50)

## 2024-08-26 ENCOUNTER — Ambulatory Visit: Payer: Self-pay | Admitting: Family Medicine

## 2024-10-25 ENCOUNTER — Ambulatory Visit: Admitting: Family Medicine

## 2024-12-22 ENCOUNTER — Ambulatory Visit: Admitting: Family Medicine

## 2025-03-02 ENCOUNTER — Ambulatory Visit
